# Patient Record
Sex: Male | Born: 1955 | Race: White | Hispanic: No | Smoking: Never smoker
Health system: Southern US, Community
[De-identification: ages and names within clinical notes are randomized; demographics above are authoritative.]

## PROBLEM LIST (undated history)

## (undated) DIAGNOSIS — F32A Depression, unspecified: Secondary | ICD-10-CM

## (undated) DIAGNOSIS — I10 Essential (primary) hypertension: Secondary | ICD-10-CM

---

## 2021-08-31 ENCOUNTER — Other Ambulatory Visit: Payer: Self-pay

## 2021-08-31 ENCOUNTER — Emergency Department: Payer: Medicare HMO

## 2021-08-31 ENCOUNTER — Inpatient Hospital Stay
Admission: EM | Admit: 2021-08-31 | Discharge: 2021-09-01 | DRG: 300 | Discharge status: Against Medical Advice | Payer: Medicare HMO | Attending: Hospitalist | Admitting: Hospitalist

## 2021-08-31 ENCOUNTER — Other Ambulatory Visit (INDEPENDENT_AMBULATORY_CARE_PROVIDER_SITE_OTHER): Payer: Self-pay | Admitting: Vascular Surgery

## 2021-08-31 DIAGNOSIS — Z7982 Long term (current) use of aspirin: Secondary | ICD-10-CM

## 2021-08-31 DIAGNOSIS — I7779 Dissection of other artery: Secondary | ICD-10-CM | POA: Diagnosis not present

## 2021-08-31 DIAGNOSIS — I748 Embolism and thrombosis of other arteries: Secondary | ICD-10-CM | POA: Diagnosis present

## 2021-08-31 DIAGNOSIS — R55 Syncope and collapse: Secondary | ICD-10-CM

## 2021-08-31 DIAGNOSIS — Z8249 Family history of ischemic heart disease and other diseases of the circulatory system: Secondary | ICD-10-CM

## 2021-08-31 DIAGNOSIS — F419 Anxiety disorder, unspecified: Secondary | ICD-10-CM | POA: Diagnosis present

## 2021-08-31 DIAGNOSIS — I774 Celiac artery compression syndrome: Secondary | ICD-10-CM

## 2021-08-31 DIAGNOSIS — K219 Gastro-esophageal reflux disease without esophagitis: Secondary | ICD-10-CM | POA: Diagnosis present

## 2021-08-31 DIAGNOSIS — I10 Essential (primary) hypertension: Secondary | ICD-10-CM | POA: Diagnosis present

## 2021-08-31 DIAGNOSIS — K760 Fatty (change of) liver, not elsewhere classified: Secondary | ICD-10-CM | POA: Diagnosis present

## 2021-08-31 DIAGNOSIS — F32A Depression, unspecified: Secondary | ICD-10-CM | POA: Diagnosis present

## 2021-08-31 DIAGNOSIS — Z5321 Procedure and treatment not carried out due to patient leaving prior to being seen by health care provider: Secondary | ICD-10-CM | POA: Diagnosis present

## 2021-08-31 DIAGNOSIS — G4700 Insomnia, unspecified: Secondary | ICD-10-CM | POA: Diagnosis present

## 2021-08-31 DIAGNOSIS — Z20822 Contact with and (suspected) exposure to covid-19: Secondary | ICD-10-CM | POA: Diagnosis present

## 2021-08-31 DIAGNOSIS — Z79899 Other long term (current) drug therapy: Secondary | ICD-10-CM

## 2021-08-31 DIAGNOSIS — R109 Unspecified abdominal pain: Secondary | ICD-10-CM

## 2021-08-31 DIAGNOSIS — D72829 Elevated white blood cell count, unspecified: Secondary | ICD-10-CM | POA: Diagnosis present

## 2021-08-31 DIAGNOSIS — E785 Hyperlipidemia, unspecified: Secondary | ICD-10-CM | POA: Diagnosis present

## 2021-08-31 DIAGNOSIS — R911 Solitary pulmonary nodule: Secondary | ICD-10-CM | POA: Diagnosis present

## 2021-08-31 HISTORY — DX: Essential (primary) hypertension: I10

## 2021-08-31 HISTORY — DX: Depression, unspecified: F32.A

## 2021-08-31 LAB — RESP PANEL BY RT-PCR (FLU A&B, COVID) ARPGX2
Influenza A by PCR: NEGATIVE
Influenza B by PCR: NEGATIVE
SARS Coronavirus 2 by RT PCR: NEGATIVE

## 2021-08-31 LAB — CBC WITH DIFFERENTIAL/PLATELET
Abs Immature Granulocytes: 0.05 10*3/uL (ref 0.00–0.07)
Basophils Absolute: 0 10*3/uL (ref 0.0–0.1)
Basophils Relative: 0 %
Eosinophils Absolute: 0.3 10*3/uL (ref 0.0–0.5)
Eosinophils Relative: 2 %
HCT: 41.3 % (ref 39.0–52.0)
Hemoglobin: 14.1 g/dL (ref 13.0–17.0)
Immature Granulocytes: 0 %
Lymphocytes Relative: 13 %
Lymphs Abs: 1.6 10*3/uL (ref 0.7–4.0)
MCH: 31.5 pg (ref 26.0–34.0)
MCHC: 34.1 g/dL (ref 30.0–36.0)
MCV: 92.2 fL (ref 80.0–100.0)
Monocytes Absolute: 0.8 10*3/uL (ref 0.1–1.0)
Monocytes Relative: 7 %
Neutro Abs: 9 10*3/uL — ABNORMAL HIGH (ref 1.7–7.7)
Neutrophils Relative %: 78 %
Platelets: 272 10*3/uL (ref 150–400)
RBC: 4.48 MIL/uL (ref 4.22–5.81)
RDW: 12.8 % (ref 11.5–15.5)
WBC: 11.8 10*3/uL — ABNORMAL HIGH (ref 4.0–10.5)
nRBC: 0 % (ref 0.0–0.2)

## 2021-08-31 LAB — LIPASE, BLOOD: Lipase: 52 U/L — ABNORMAL HIGH (ref 11–51)

## 2021-08-31 LAB — COMPREHENSIVE METABOLIC PANEL
ALT: 32 U/L (ref 0–44)
AST: 40 U/L (ref 15–41)
Albumin: 3.7 g/dL (ref 3.5–5.0)
Alkaline Phosphatase: 61 U/L (ref 38–126)
Anion gap: 7 (ref 5–15)
BUN: 27 mg/dL — ABNORMAL HIGH (ref 8–23)
CO2: 27 mmol/L (ref 22–32)
Calcium: 9.2 mg/dL (ref 8.9–10.3)
Chloride: 103 mmol/L (ref 98–111)
Creatinine, Ser: 1.23 mg/dL (ref 0.61–1.24)
GFR, Estimated: >60 mL/min (ref >60)
Glucose, Bld: 129 mg/dL — ABNORMAL HIGH (ref 70–99)
Potassium: 4 mmol/L (ref 3.5–5.1)
Sodium: 137 mmol/L (ref 135–145)
Total Bilirubin: 0.8 mg/dL (ref 0.3–1.2)
Total Protein: 7.2 g/dL (ref 6.5–8.1)

## 2021-08-31 LAB — URINALYSIS, COMPLETE (UACMP) WITH MICROSCOPIC
Bilirubin Urine: NEGATIVE
Glucose, UA: NEGATIVE mg/dL
Hgb urine dipstick: NEGATIVE
Ketones, ur: NEGATIVE mg/dL
Leukocytes,Ua: NEGATIVE
Nitrite: NEGATIVE
Protein, ur: NEGATIVE mg/dL
Specific Gravity, Urine: 1.014 (ref 1.005–1.030)
Squamous Epithelial / HPF: NONE SEEN (ref 0–5)
pH: 6 (ref 5.0–8.0)

## 2021-08-31 LAB — HEMOGLOBIN AND HEMATOCRIT, BLOOD
HCT: 33.6 % — ABNORMAL LOW (ref 39.0–52.0)
HCT: 37 % — ABNORMAL LOW (ref 39.0–52.0)
HCT: 37.3 % — ABNORMAL LOW (ref 39.0–52.0)
Hemoglobin: 11.4 g/dL — ABNORMAL LOW (ref 13.0–17.0)
Hemoglobin: 12.5 g/dL — ABNORMAL LOW (ref 13.0–17.0)
Hemoglobin: 12.8 g/dL — ABNORMAL LOW (ref 13.0–17.0)

## 2021-08-31 LAB — TYPE AND SCREEN
ABO/RH(D): A NEG
Antibody Screen: NEGATIVE

## 2021-08-31 LAB — LACTIC ACID, PLASMA: Lactic Acid, Venous: 1.2 mmol/L (ref 0.5–1.9)

## 2021-08-31 LAB — TROPONIN I (HIGH SENSITIVITY)
Troponin I (High Sensitivity): 3 ng/L (ref <18)
Troponin I (High Sensitivity): <2 ng/L (ref <18)

## 2021-08-31 LAB — HIV ANTIBODY (ROUTINE TESTING W REFLEX): HIV Screen 4th Generation wRfx: NONREACTIVE

## 2021-08-31 MED ORDER — LAMOTRIGINE 100 MG PO TABS: 500.0000 mg | ORAL_TABLET | Freq: Every day | ORAL | Status: DC

## 2021-08-31 MED ORDER — TRAZODONE HCL 50 MG PO TABS
150.0000 mg | ORAL_TABLET | Freq: Every evening | ORAL | Status: DC | PRN
  Administered 2021-08-31: 150 mg via ORAL
  Filled 2021-08-31: qty 1

## 2021-08-31 MED ORDER — PANTOPRAZOLE SODIUM 40 MG PO TBEC
80.0000 mg | DELAYED_RELEASE_TABLET | Freq: Every day | ORAL | Status: DC
  Administered 2021-08-31 – 2021-09-01 (×2): 80 mg via ORAL
  Filled 2021-08-31 (×2): qty 2

## 2021-08-31 MED ORDER — PROPRANOLOL HCL ER 80 MG PO CP24
80.0000 mg | ORAL_CAPSULE | Freq: Every day | ORAL | Status: DC
  Administered 2021-08-31 – 2021-09-01 (×2): 80 mg via ORAL
  Filled 2021-08-31 (×3): qty 1

## 2021-08-31 MED ORDER — ACETAMINOPHEN 650 MG RE SUPP
650.0000 mg | Freq: Four times a day (QID) | RECTAL | Status: DC | PRN
  Filled 2021-08-31: qty 1

## 2021-08-31 MED ORDER — VENLAFAXINE HCL ER 75 MG PO CP24
300.0000 mg | ORAL_CAPSULE | Freq: Every day | ORAL | Status: DC
  Administered 2021-08-31 – 2021-09-01 (×2): 300 mg via ORAL
  Filled 2021-08-31: qty 2
  Filled 2021-08-31: qty 4

## 2021-08-31 MED ORDER — LAMOTRIGINE 100 MG PO TABS
500.0000 mg | ORAL_TABLET | Freq: Every day | ORAL | Status: DC
  Administered 2021-08-31: 500 mg via ORAL
  Filled 2021-08-31: qty 5

## 2021-08-31 MED ORDER — ONDANSETRON HCL 4 MG/2ML IJ SOLN: 4.0000 mg | Freq: Four times a day (QID) | INTRAMUSCULAR | Status: DC | PRN

## 2021-08-31 MED ORDER — SODIUM CHLORIDE 0.9 % IV BOLUS
1000.0000 mL | Freq: Once | INTRAVENOUS | Status: AC
  Administered 2021-08-31: 1000 mL via INTRAVENOUS

## 2021-08-31 MED ORDER — HEPARIN SODIUM (PORCINE) 5000 UNIT/ML IJ SOLN
5000.0000 [IU] | Freq: Three times a day (TID) | INTRAMUSCULAR | Status: AC
  Administered 2021-08-31 – 2021-09-01 (×2): 5000 [IU] via SUBCUTANEOUS
  Filled 2021-08-31 (×2): qty 1

## 2021-08-31 MED ORDER — IOHEXOL 300 MG/ML  SOLN
100.0000 mL | Freq: Once | INTRAMUSCULAR | Status: AC | PRN
  Administered 2021-08-31: 100 mL via INTRAVENOUS

## 2021-08-31 MED ORDER — HEPARIN SODIUM (PORCINE) 5000 UNIT/ML IJ SOLN: 5000.0000 [IU] | Freq: Three times a day (TID) | INTRAMUSCULAR | Status: DC

## 2021-08-31 MED ORDER — IOHEXOL 350 MG/ML SOLN
100.0000 mL | Freq: Once | INTRAVENOUS | Status: AC | PRN
  Administered 2021-08-31: 100 mL via INTRAVENOUS

## 2021-08-31 MED ORDER — SODIUM CHLORIDE 0.9 % IV SOLN: INTRAVENOUS | Status: DC

## 2021-08-31 MED ORDER — PRAVASTATIN SODIUM 20 MG PO TABS: 40.0000 mg | ORAL_TABLET | Freq: Every day | ORAL | Status: DC

## 2021-08-31 MED ORDER — ATORVASTATIN CALCIUM 20 MG PO TABS
80.0000 mg | ORAL_TABLET | Freq: Every day | ORAL | Status: DC
  Filled 2021-08-31: qty 4

## 2021-08-31 MED ORDER — ONDANSETRON HCL 4 MG PO TABS: 4.0000 mg | ORAL_TABLET | Freq: Four times a day (QID) | ORAL | Status: DC | PRN

## 2021-08-31 MED ORDER — ACETAMINOPHEN 325 MG PO TABS: 650.0000 mg | ORAL_TABLET | Freq: Four times a day (QID) | ORAL | Status: DC | PRN

## 2021-08-31 MED ORDER — BUSPIRONE HCL 15 MG PO TABS
15.0000 mg | ORAL_TABLET | Freq: Three times a day (TID) | ORAL | Status: DC
  Administered 2021-08-31 – 2021-09-01 (×3): 15 mg via ORAL
  Filled 2021-08-31 (×5): qty 1

## 2021-08-31 NOTE — ED Notes (Signed)
Pt sitting up on side of bed eating lunch tray.

## 2021-08-31 NOTE — Consult Note (Signed)
[facility] SURGICAL CONSULTATION NOTE (initial) - cpt: 37858   HISTORY OF PRESENT ILLNESS (HPI):  65 y.o. male presented to [facility] ED today for evaluation of abdominal pain and syncope. Patient reports that in the preceding 2-3 days he had what he described as a burning sensation in his abdomen which was not exacerbated or relieved by anything. He had some nausea associated with this. He got up to go to the bathroom in the middle of the night and woke up on the floor. He denied any CP, SOB, lightheadedness, or dizziness prior to the syncopal episode. He denied any fever, chills, emesis, diarrhea, or blood in his stool. No previous intra-abdominal surgeries. He is on 81 mg ASA but otherwise no anticoagulation. Work up in the ED revealed a mild leukocytosis to 11.8K, Hgb 14.1, sCr in normal ranges at 1.23, no electrolyte derangements, and lactic acid level was normal at 1.2. 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 He was also found to have a very small amount of left pericolic gutter fluid.   Surgery is consulted by emergency medicine physician Dr. 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  PAST MEDICAL HISTORY (PMH):  History reviewed. No pertinent past medical history.   PAST SURGICAL HISTORY (PSH):  Reviewed   MEDICATIONS:  Prior to Admission medications   Medication Sig Start Date End Date Taking? Authorizing Provider  Aspirin 81 MG CAPS Take 1 tablet by mouth daily. 07/25/08  Yes [provider]  atorvastatin (LIPITOR) 80 MG tablet Take 80 mg by mouth daily. 08/19/21  Yes [provider]  busPIRone (BUSPAR) 15 MG tablet Take 15 mg by mouth 3 (three) times daily. 08/23/21  Yes [provider]  Ciclopirox 1 % shampoo  11/06/13  Yes [provider]  ketoconazole (NIZORAL) 2 %  shampoo Apply topically. 05/03/14  Yes [provider]  lamoTRIgine (LAMICTAL) 200 MG tablet Take 500 mg by mouth daily. 06/02/14 11/21/21 Yes [provider]  omeprazole (PRILOSEC) 40 MG capsule Take 40 mg by mouth daily. 08/03/21  Yes [provider]  pravastatin (PRAVACHOL) 40 MG tablet Take 1 tablet by mouth daily. 07/07/14  Yes [provider]  propranolol ER (INDERAL LA) 80 MG 24 hr capsule Take 80 mg by mouth daily. 08/03/21  Yes [provider]  Selenium Sulfide 2.25 % SHAM Apply topically. 08/19/21  Yes [provider]  tamsulosin (FLOMAX) 0.4 MG CAPS capsule Take 0.4 mg by mouth daily. 07/02/21  Yes [provider]  traZODone (DESYREL) 150 MG tablet Take 150-300 mg by mouth at bedtime as needed. 07/05/21  Yes [provider]  venlafaxine XR (EFFEXOR-XR) 150 MG 24 hr capsule Take 300 mg by mouth daily. 07/21/21  Yes [provider]  Vitamin D, Cholecalciferol, 10 MCG (400 UNIT) CAPS Take 2 capsules by mouth daily. 11/12/08  Yes [provider]  fenofibrate (TRICOR) 145 MG tablet Take 145 mg by mouth daily. Patient not taking: Reported on 08/31/2021 06/12/21   [provider]  ranitidine (ZANTAC) 150 MG tablet Take 1 tablet by mouth daily. Patient not taking: Reported on 08/31/2021 06/09/14   [provider]     ALLERGIES:  No Known Allergies   SOCIAL HISTORY:  Social History   Socioeconomic History   Marital status: Legally Separated    Spouse name: Not on file  Number of children: Not on file   Years of education: Not on file   Highest education level: Not on file  Occupational History   Not on file  Tobacco Use   Smoking status: Not on file   Smokeless tobacco: Not on file  Substance and Sexual Activity   Alcohol use: Not on file   Drug use: Not on file   Sexual activity: Not on file  Other Topics Concern   Not on file  Social History Narrative   Not on file   Social  Determinants of Health   Financial Resource Strain: Not on file  Food Insecurity: Not on file  Transportation Needs: Not on file  Physical Activity: Not on file  Stress: Not on file  Social Connections: Not on file  Intimate Partner Violence: Not on file     FAMILY HISTORY:  No family history on file.    REVIEW OF SYSTEMS:  Review of Systems  Constitutional:  Negative for chills and fever.  Respiratory:  Negative for cough and shortness of breath.   Cardiovascular:  Negative for chest pain and palpitations.  Gastrointestinal:  Positive for abdominal pain. Negative for blood in stool, constipation, diarrhea, nausea and vomiting.  Genitourinary:  Negative for dysuria and urgency.  Neurological:  Positive for loss of consciousness. Negative for dizziness and headaches.  All other systems reviewed and are negative.  VITAL SIGNS:  Temp:  [97.9 F (36.6 C)] 97.9 F (36.6 C) (11/22 0541) Pulse Rate:  [77-102] 91 (11/22 1200) Resp:  [14-20] 15 (11/22 1200) BP: (103-135)/(67-79) 111/74 (11/22 1200) SpO2:  [94 %-100 %] 95 % (11/22 1200) Weight:  [102.1 kg] 102.1 kg (11/22 0541)     Height: 6' (182.9 cm) Weight: 102.1 kg BMI (Calculated): 30.52   INTAKE/OUTPUT:  No intake/output data recorded.  PHYSICAL EXAM:  Physical Exam Vitals and nursing note reviewed.  Constitutional:      General: He is not in acute distress.    Appearance: He is well-developed. He is obese. He is not ill-appearing.  HENT:     Head: Normocephalic and atraumatic.  Eyes:     General: No scleral icterus.    Extraocular Movements: Extraocular movements intact.     Pupils: Pupils are equal, round, and reactive to light.  Cardiovascular:     Rate and Rhythm: Normal rate and regular rhythm.     Heart sounds: Normal heart sounds. No murmur heard. Pulmonary:     Effort: Pulmonary effort is normal. No respiratory distress.     Breath sounds: Normal breath sounds.  Abdominal:     General: There is no  distension.     Palpations: Abdomen is soft.     Tenderness: There is no abdominal tenderness. There is no guarding or rebound.     Comments: Abdomen is obese, soft, he is non-tender, non-distended, no rebound/guarding. He is certainly without any evidence of peritonitis  Genitourinary:    Comments: Deferred Skin:    General: Skin is warm and dry.     Coloration: Skin is not jaundiced or pale.  Neurological:     General: No focal deficit present.     Mental Status: He is alert and oriented to person, place, and time.  Psychiatric:        Mood and Affect: Mood normal.        Behavior: Behavior normal.     Labs:  CBC Latest Ref Rng & Units 08/31/2021  WBC 4.0 - 10.5 K/uL 11.8(H)  Hemoglobin 13.0 -  17.0 g/dL 14.1  Hematocrit 39.0 - 52.0 % 41.3  Platelets 150 - 400 K/uL 272   CMP Latest Ref Rng & Units 08/31/2021  Glucose 70 - 99 mg/dL 129(H)  BUN 8 - 23 mg/dL 27(H)  Creatinine 0.61 - 1.24 mg/dL 1.23  Sodium 135 - 145 mmol/L 137  Potassium 3.5 - 5.1 mmol/L 4.0  Chloride 98 - 111 mmol/L 103  CO2 22 - 32 mmol/L 27  Calcium 8.9 - 10.3 mg/dL 9.2  Total Protein 6.5 - 8.1 g/dL 7.2  Total Bilirubin 0.3 - 1.2 mg/dL 0.8  Alkaline Phos 38 - 126 U/L 61  AST 15 - 41 U/L 40  ALT 0 - 44 U/L 32     Imaging studies:   CT Abdomen/Pelvis (08/31/2021) personally reviewed showing celiac trunk dissection, small amount of left pericolic gutter fluid, no free air, no evidence of bowel ischemia, and radiologist report reviewed:  IMPRESSION: 1. 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 The splenic and common hepatic arteries are opacified distally. Recommend arterial phase CT angiogram to further evaluate and vascular consultation. 2. Note incidental vascular variant anatomy of the celiac branches with a separate origin of the left gastric artery superiorly. 3. Hepatic  steatosis. 4. Mild thickening of the urinary bladder wall, likely related to chronic outlet obstruction. 5. Coronary artery disease.   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 Visceral branches arising from the dissected celiac trunk remain opacified with contrast despite extensive stranding and signs of dissection.   Accessory LEFT hepatic artery as described.   Slight increase in fluid in the LEFT lower quadrant measures density that could be seen in the setting of small volume pneumoperitoneum. Perhaps this somehow relates to recent iodinated contrast administration and delayed enhancement of ascitic fluid. Occult bowel injury can present with isolated hemoperitoneum as well though this is not favored as there is no sign of focal bowel thickening or other findings to suggest trauma. No visible solid organ injury is demonstrated. I   Mild jejunal mesenteric stranding. In the setting of visceral arterial dissection would suggest lactate correlation. This may also be helpful in light of above findings of potential small volume hemoperitoneum.   Indeterminate potentially cystic lesion arising from the lower pole the LEFT kidney may represent a hemorrhagic cyst. Consider follow-up renal sonogram in this an initial means of further evaluating this abnormality on a nonemergent basis to exclude a solid renal lesion.   Multiple pulmonary nodules. Most severe: 4 mm left solid pulmonary nodule. No routine follow-up imaging is recommended, particularly in low risk patient population per [place]. while these guidelines do not apply to immunocompromised patients and patients with  cancer. Follow up in patients with significant comorbidities as clinically warranted. For lung cancer screening, adhere to Lung-RADS guidelines. Reference: Radiology. 2017; 284(1):228-43.   Assessment/Plan: (ICD-10's: R10.9) 65 y.o. 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   - Agree with medicine admission with vascular surgery consult - Nothing to add from general surgery perspective - Monitor abdominal  examination; on-going bowel function - Pain control prn; antiemetics prn   - Further management per primary and consulting teams; we will sign off but be available should need arise    All of the above findings and recommendations were discussed with the patient, and all of patient's questions were answered to his expressed satisfaction.  Thank you for the opportunity to participate in this patient's care.   -- Edison Simon, PA-C [facility] 08/31/2021, 12:08 PM [phone] M-F: 7am - 4pm

## 2021-08-31 NOTE — ED Notes (Signed)
Patient transported to CT 

## 2021-08-31 NOTE — ED Provider Notes (Signed)
[facility]  ____________________________________________   Event Date/Time   First MD Initiated Contact with Patient 08/31/21 [phone]     (approximate)  I have reviewed the triage vital signs and the nursing notes.   HISTORY  Chief Complaint Abdominal Pain    HPI Christian Mills is a 65 y.o. male with past medical history of hypertension, diabetes type 2, CKD, depression who presents with abdominal pain and syncope.  Patient tells me that over the past 3 days he has had an intermittent burning sensation in the center of his abdomen.  It is nonradiating and not worsened by eating.  Tonight the pain was worse when he went to bed and then woke him from sleep.  He got up to use the bathroom felt like he was going to vomit or have diarrhea but did not then walking back to bed he then fell and woke up on the floor.  Did not have any preceding chest pain shortness of breath or palpitations.  Did not have prodromal lightheadedness or dizziness.  Was in pain prior to the fall.  Patient denies dark stools or blood in his stool vomiting or nausea.  He denies chest pain shortness of breath or back pain.  Denies heavy NSAID use.        PMH HTN T2 DM CKD Depression   Prior to Admission medications   Not on File    Allergies Patient has no known allergies.  No family history on file.  Social History    Review of Systems   Review of Systems  Constitutional:  Negative for appetite change, chills and fever.  Respiratory:  Negative for chest tightness and shortness of breath.   Cardiovascular:  Negative for chest pain.  Gastrointestinal:  Positive for abdominal pain. Negative for blood in stool, constipation, diarrhea, nausea and vomiting.  Genitourinary:  Negative for dysuria.  Neurological:  Positive for syncope. Negative for light-headedness.  All other systems reviewed and are negative.  Physical Exam Updated Vital Signs BP 135/78   Pulse 80   Temp 97.9 F  (36.6 C) (Oral)   Resp 19   Ht 6' (1.829 m)   Wt 102.1 kg   SpO2 99%   BMI 30.53 kg/m   Physical Exam Vitals and nursing note reviewed.  Constitutional:      General: He is not in acute distress.    Appearance: Normal appearance.  HENT:     Head: Normocephalic and atraumatic.  Eyes:     General: No scleral icterus.    Conjunctiva/sclera: Conjunctivae normal.  Cardiovascular:     Rate and Rhythm: Normal rate and regular rhythm.  Pulmonary:     Effort: Pulmonary effort is normal. No respiratory distress.     Breath sounds: Normal breath sounds. No wheezing.  Abdominal:     General: Abdomen is flat. There is no distension.     Palpations: Abdomen is soft.     Tenderness: There is no abdominal tenderness. There is no guarding.  Musculoskeletal:        General: No deformity or signs of injury.     Cervical back: Normal range of motion.  Skin:    Coloration: Skin is not jaundiced or pale.  Neurological:     General: No focal deficit present.     Mental Status: He is alert and oriented to person, place, and time. Mental status is at baseline.  Psychiatric:        Mood and Affect: Mood normal.  Behavior: Behavior normal.     LABS (all labs ordered are listed, but only abnormal results are displayed)  Labs Reviewed  CBC WITH DIFFERENTIAL/PLATELET - Abnormal; Notable for the following components:      Result Value   WBC 11.8 (*)    Neutro Abs 9.0 (*)    All other components within normal limits  URINALYSIS, COMPLETE (UACMP) WITH MICROSCOPIC  COMPREHENSIVE METABOLIC PANEL  LIPASE, BLOOD  TROPONIN I (HIGH SENSITIVITY)  TROPONIN I (HIGH SENSITIVITY)   ____________________________________________  EKG  NSR, nml axis, nml intervals, no acute ischemic changes  ____________________________________________  RADIOLOGY Ky Barban, personally viewed and evaluated these images (plain radiographs) as part of my medical decision making, as well as reviewing  the written report by the radiologist.  ED MD interpretation:  CT A/P pending    ____________________________________________   PROCEDURES  Procedure(s) performed (including Critical Care):  Procedures   ____________________________________________   INITIAL IMPRESSION / ASSESSMENT AND PLAN / ED COURSE      65 year old male presents with 3 days of abdominal pain that is intermittent and a syncopal episode today.  Pain described as burning generalized in location and intermittent.  Not significantly worse after eating.  No associated nausea vomiting or diarrhea.  Today patient woke up from sleep with the pain and felt like he had to have an episode of vomiting or diarrhea.  Upon returning from the bathroom he had a syncopal episode without prodrome but did have significant pain at the time.  On arrival to the ED he is well-appearing.  Blood pressure within normal limits.  Patient is well-appearing.  His abdomen is benign and he denies pain currently.  Given his syncope and abdominal pain I did a quick bedside ultrasound which shows a normal caliber aorta.  We will get labs including a troponin and CT abdomen pelvis.  At the time of signout he is pending a majority of his work-up.  If no concerning findings on CT or labs I think he would be appropriate for discharge.   ____________________________________________   FINAL CLINICAL IMPRESSION(S) / ED DIAGNOSES  Final diagnoses:  Abdominal pain, unspecified abdominal location  Syncope, unspecified syncope type     ED Discharge Orders     None        Note:  This document was prepared using Dragon voice recognition software and may include unintentional dictation errors.    Georga Hacking, MD 08/31/21 [phone]

## 2021-08-31 NOTE — ED Provider Notes (Signed)
8:19 AM Assumed care for off going team.   Blood pressure 104/71, pulse 91, temperature 97.9 F (36.6 C), temperature source Oral, resp. rate 15, height 6' (1.829 m), weight 102.1 kg, SpO2 97 %.  See their HPI for full report but in brief pending labs. CT.   8:51 AM reevaluated patient.  Labs are still in process.  Patient is denying hitting his head denies any headache or neck pain or chest pain.  He reports he only had a burning sensation in his abdomen with a little nausea.  Symptoms are now relieved.  9:39 AM CT concerning for dissection of the celiac axis and recommend a CTA and vascular consult.  Will discuss with Dr. Gilda Crease   11:18 AM discussed with radiology who stated that there is a focal dissection of the celiac artery with some thrombosis.  He also notes a little bit of hemoperitoneum in the left lower quadrant very small amount unclear why.  11:52 AM discussed with Dr. Gilda Crease from vascular who will evaluate patient but plan to do serial hemoglobins and if stable start him on heparin tomorrow and plan for possible celiac angiogram tomorrow.  I also will consult surgery Dr. Aleen Campi   12:04 PM  D/w Dr. Aleen Campi unlikely anything surgical to do but he will come down and evaluate patient.  Will discuss with the hospital team for admission with vascular surgery and general surgery consulted on.                Concha Se, MD 08/31/21 [phone]

## 2021-08-31 NOTE — ED Notes (Signed)
RN called lab to check on CMP status. Lab tech states Cr is 1.24 and CT made aware

## 2021-08-31 NOTE — ED Triage Notes (Signed)
Signed            Pt in from home via [facility] with c/o "burning pain" to central abdomen, on going 2-3 days. Pt states he got up to go to the bathroom this morning, and had syncopal event. Reporting nausea and some urge to have diarrhea. Denies any cp, black stools or sob. CBG 249

## 2021-08-31 NOTE — H&P (Addendum)
History and Physical   Christian Mills [id number] DOB: 1956-09-15 DOA: 08/31/2021  PCP: [facility]  Outpatient Specialists:  Patient coming from: [facility]  I have personally briefly reviewed patient's old medical records in [place].  Chief Concern: Syncope  HPI: Christian Mills is a 65 y.o. male with medical history significant for hyperlipidemia, depression, GERD, hypertension  He was asleep, woke up feeling burning sensation in his belly. He then felt nauseous and thought he needed to vomit so he went to bathroom, he did not vomit. On his way back from the restroom, he felt weak and he collapsed. He states that he must have passed out because he woke up on the floor. He called 911 from his cell phone approximately at 3:07 AM. He is unsure if he hit his head, though he states that he doesn't have any headache and/or feel knots in his head. He states that he was sleeping when he woke up. He states it was still dark outside but he was not sure of the time.   He endorses diaphoresis when he came to.   Prior to passing out, he denies chest pain, shortness of breath, dysuria, diarrhea, hematuria, double vision, headache, new cough, fever, chills, dysphagia. He does not know how long he was out. He states that this has never happened before.   He states that in the last couple of days, he has had abdominal burning sensation that has lasted several hours. He states that it helps when he goes to sleep in the last two nights.   Social history: He lives at home by himself. He denies tobacco, etoh, recreational drugs. He is retired and prior worked as a Engineer, structural in [place], [place].   Vaccination history: He is vaccinated for covid 19, three doses of Pfizer.  ROS: Constitutional: no weight change, no fever ENT/Mouth: no sore throat, no rhinorrhea Eyes: no eye pain, no vision changes Cardiovascular: no chest pain, no dyspnea,  no edema, no palpitations Respiratory: no  cough, no sputum, no wheezing Gastrointestinal: + nausea, no vomiting, no diarrhea, no constipation Genitourinary: no urinary incontinence, no dysuria, no hematuria Musculoskeletal: no arthralgias, no myalgias Skin: no skin lesions, no pruritus, Neuro: + weakness, + loss of consciousness, + syncope Psych: no anxiety, no depression, + decrease appetite Heme/Lymph: no bruising, no bleeding  ED Course: Discussed with emergency medicine provider, patient requiring hospitalization for chief concerns of syncope.    In the emergency department, her vitals showed temperature of 97.9, respiration rate of 20, heart rate of 79, initial blood pressure 120/79, SPO2 of 100% on room air.  Labs in the emergency department was remarkable for serum sodium 137, potassium 4, chloride is 103, bicarb 27, BUN of 27, serum creatinine of 1.23, nonfasting blood glucose 129.  GFR greater than 60.  WBC 11.8, hemoglobin initially was 14.1 and decreased to 12.5.  Platelets is 272.  Lactic acid was 1.2.  UA was negative for leukocytes and nitrates.  Assessment/Plan  Principal Problem:   Celiac artery dissection (HCC) Active Problems:   Depression   Insomnia   Hyperlipidemia   Hepatic steatosis   Leukocytosis   # Celiac artery thrombosis with dissection - Vascular has been consulted - N.p.o. after midnight for plans of stent placement on 09/01/2021  # Depression # Psychiatric imbalance - Resumed home buspirone 50 mg p.o. 3 times daily, venlafaxine 300 mg p.o. daily, lamotrigine 500 mg daily nightly  # Left lower lobe nodule-4 mm, does not have  risk factors for carcinoma as he denies adamantly that he does not smoke or use tobacco products and denies known history of carcinoma  # Insomnia-resumed trazodone 150 mg nightly # Hypertension anxiety-resume home propranolol 80 mg daily # Hyperlipidemia-atorvastatin 80 mg nightly # GERD-PPI  # DVT prophylaxis-I ordered 2 doses of heparin 5000 units subcutaneous every  8 hours - A.m. team to resume DVT prophylaxis when appropriate  Chart reviewed.   DVT prophylaxis: Heparin 5000 units subcutaneous every 8 hours Code Status: Full code Diet: Heart healthy now, n.p.o. after midnight Family Communication: No, patient states he will communicate with his family Disposition Plan: Pending clinical course Consults called: Vascular, general surgery Admission status: Telemetry medical, observation  Past Medical History:  Diagnosis Date   Depression    Hypertension    History reviewed. No pertinent surgical history.  Social History:  reports that he has never smoked. He has never used smokeless tobacco. He reports that he does not drink alcohol and does not use drugs.  No Known Allergies Family History  Problem Relation Age of Onset   Hypertension Mother    Hypertension Father    Family history: Family history reviewed and not pertinent  Prior to Admission medications   Medication Sig Start Date End Date Taking? Authorizing Provider  Aspirin 81 MG CAPS Take 1 tablet by mouth daily. 07/25/08  Yes [provider]  atorvastatin (LIPITOR) 80 MG tablet Take 80 mg by mouth daily. 08/19/21  Yes [provider]  busPIRone (BUSPAR) 15 MG tablet Take 15 mg by mouth 3 (three) times daily. 08/23/21  Yes [provider]  Ciclopirox 1 % shampoo  11/06/13  Yes [provider]  ketoconazole (NIZORAL) 2 % shampoo Apply topically. 05/03/14  Yes [provider]  lamoTRIgine (LAMICTAL) 200 MG tablet Take 500 mg by mouth daily. 06/02/14 11/21/21 Yes [provider]  omeprazole (PRILOSEC) 40 MG capsule Take 40 mg by mouth daily. 08/03/21  Yes [provider]  pravastatin (PRAVACHOL) 40 MG tablet Take 1 tablet by mouth daily. 07/07/14  Yes [provider]  propranolol ER (INDERAL LA) 80 MG 24 hr capsule Take 80 mg by mouth daily. 08/03/21  Yes [provider]  Selenium Sulfide 2.25 % SHAM Apply  topically. 08/19/21  Yes [provider]  tamsulosin (FLOMAX) 0.4 MG CAPS capsule Take 0.4 mg by mouth daily. 07/02/21  Yes [provider]  traZODone (DESYREL) 150 MG tablet Take 150-300 mg by mouth at bedtime as needed. 07/05/21  Yes [provider]  venlafaxine XR (EFFEXOR-XR) 150 MG 24 hr capsule Take 300 mg by mouth daily. 07/21/21  Yes [provider]  Vitamin D, Cholecalciferol, 10 MCG (400 UNIT) CAPS Take 2 capsules by mouth daily. 11/12/08  Yes [provider]  fenofibrate (TRICOR) 145 MG tablet Take 145 mg by mouth daily. Patient not taking: Reported on 08/31/2021 06/12/21   [provider]  ranitidine (ZANTAC) 150 MG tablet Take 1 tablet by mouth daily. Patient not taking: Reported on 08/31/2021 06/09/14   [provider]   Physical Exam: Vitals:   08/31/21 1000 08/31/21 1101 08/31/21 1200 08/31/21 1300  BP: 117/75 113/73 111/74 106/72  Pulse: 97 93 91 93  Resp: 16 18 15 13   Temp:      TempSrc:      SpO2: 94% 95% 95% 93%  Weight:      Height:       Constitutional: appears age-appropriate, NAD, calm, comfortable Eyes: PERRL, lids and conjunctivae  normal ENMT: Mucous membranes are moist. Posterior pharynx clear of any exudate or lesions. Age-appropriate dentition. Hearing appropriate Neck: normal, supple, no masses, no thyromegaly Respiratory: clear to auscultation bilaterally, no wheezing, no crackles. Normal respiratory effort. No accessory muscle use.  Cardiovascular: Regular rate and rhythm, no murmurs / rubs / gallops. No extremity edema. 2+ pedal pulses. No carotid bruits.  Abdomen: no tenderness, no masses palpated, no hepatosplenomegaly. Bowel sounds positive.  Musculoskeletal: no clubbing / cyanosis. No joint deformity upper and lower extremities. Good ROM, no contractures, no atrophy. Normal muscle tone.  Skin: no rashes, lesions, ulcers. No induration Neurologic: Sensation intact. Strength 5/5 in all 4.   Psychiatric: Normal judgment and insight. Alert and oriented x 3. Normal mood.   EKG: independently reviewed, showing sinus rhythm with rate of 77, QTc 436  Chest x-ray on Admission: I personally reviewed and I agree with radiologist reading as below.  CT ABDOMEN PELVIS W CONTRAST  Result Date: 08/31/2021 CLINICAL DATA:  Abdominal pain for 3 days EXAM: CT ABDOMEN AND PELVIS WITH CONTRAST TECHNIQUE: Multidetector CT imaging of the abdomen and pelvis was performed using the standard protocol following bolus administration of intravenous contrast. CONTRAST:  OMNIPAQUE IOHEXOL 300 MG/ML  SOLN COMPARISON:  None. FINDINGS: Lower chest: No acute abnormality. Scattered left coronary artery calcifications. Hepatobiliary: No solid liver abnormality is seen. Hepatic steatosis. No gallstones, gallbladder wall thickening, or biliary dilatation. Pancreas: Unremarkable. No pancreatic ductal dilatation or surrounding inflammatory changes. Spleen: Normal in size without significant abnormality. Adrenals/Urinary Tract: Adrenal glands are unremarkable. Kidneys are normal, without renal calculi, solid lesion, or hydronephrosis. Mild thickening of the urinary bladder wall. Stomach/Bowel: Stomach is within normal limits. Appendix appears normal. No evidence of bowel wall thickening, distention, or inflammatory changes. Vascular/Lymphatic: There appears to be a dissection of the celiac axis near the origin, with mild dilatation of the vessel to approximately 1.2 cm in caliber and adjacent fat stranding (series 2, image 36, series 6, image 79). The splenic and common hepatic arteries are opacified distally. Note incidental vascular variant with a separate origin of the left gastric artery superiorly. Aortic atherosclerosis. No enlarged abdominal or pelvic lymph nodes. Reproductive: No mass or other significant abnormality. Other: No abdominal wall hernia or abnormality. No abdominopelvic ascites. Musculoskeletal: No acute  or significant osseous findings. IMPRESSION: 1. 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 The splenic and common hepatic arteries are opacified distally. Recommend arterial phase CT angiogram to further evaluate and vascular consultation. 2. Note incidental vascular variant anatomy of the celiac branches with a separate origin of the left gastric artery superiorly. 3. Hepatic steatosis. 4. Mild thickening of the urinary bladder wall, likely related to chronic outlet obstruction. 5. Coronary artery disease. Aortic Atherosclerosis (ICD10-I70.0). Electronically Signed   By: Jearld Lesch M.D.   On: 08/31/2021 09:17   CT Angio Chest/Abd/Pel for Dissection W and/or Wo Contrast  Addendum Date: 08/31/2021   ADDENDUM REPORT: 08/31/2021 15:01 ADDENDUM: Dissection of the celiac with partial thrombosis of the vessel lumen as well as higher density fluid in the abdomen as discussed in the initial report were called by telephone at the time of interpretation on 08/31/2021 at 11:15 AM to provider [facility] , who verbally acknowledged these results. Electronically Signed   By: Donzetta Kohut M.D.   On: 08/31/2021 15:01   Result Date: 08/31/2021 CLINICAL DATA:  A  65 year old male presents with abdominal pain and suspected aortic dissection, follow-up evaluation from abdomen and pelvis CT which was acquired on August 31, 2021, earlier the same date. EXAM: CT ANGIOGRAPHY CHEST, ABDOMEN AND PELVIS TECHNIQUE: Non-contrast CT of the chest was initially obtained. Multidetector CT imaging through the chest, abdomen and pelvis was performed using the standard protocol during bolus administration of intravenous contrast. Multiplanar reconstructed images and MIPs were obtained and reviewed to evaluate the vascular anatomy. CONTRAST:  141mL OMNIPAQUE IOHEXOL 350 MG/ML SOLN COMPARISON:  Comparison is made with  abdominal and pelvic CT of the same date. FINDINGS: CTA CHEST FINDINGS Cardiovascular: Noncontrast CT first obtained of the chest shows no signs of intramural hematoma or pericardial effusion. Ascending aortic caliber approximately 3.7 cm transverse dimension, when measured in the coronal plane the ascending aorta is approximately 3.6 cm greatest dimension. No signs of aortic dissection involving the thoracic aorta on this non gated evaluation. No sign of stranding adjacent to the aorta. Three-vessel branching pattern in the chest. Normal heart size without substantial pericardial effusion. Normal caliber of central pulmonary arteries which are not well assessed on this phase of contrast enhancement. Mediastinum/Nodes: No thoracic inlet, axillary, mediastinal or hilar adenopathy. Esophagus grossly normal. Lungs/Pleura: No effusion, no consolidation or pneumothorax. Airways are patent. Tiny LEFT lower lobe pulmonary nodule (image 114/6) 4 mm. Tiny LEFT lower lobe pulmonary nodule (image 106/6) 4 mm. Musculoskeletal: See below for full musculoskeletal detail. Ovoid low-density subcutaneous lesion on the first image of the study measures water density and 2.8 cm likely a sebaceous cyst. No chest wall lesion. See below for full musculoskeletal details. Review of the MIP images confirms the above findings. CTA ABDOMEN AND PELVIS FINDINGS VASCULAR Aorta: Aortic atherosclerosis. No aneurysmal dilation of the thoracic aorta. No sign of dissection or perivascular stranding about the abdominal aorta or iliac vessels. Celiac: Stranding about the celiac axis. 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 The splenic artery beyond this level is patent. Caliber of the celiac trunk approximately 12 mm. There is an accessory LEFT hepatic artery arising as a dedicated branch just above the celiac origin. Remaining hepatic vessels arising from the dissected celiac trunk are  patent despite stranding surrounding the common hepatic artery, likely due to dissection propagation into this vessel. SMA: Widely patent without dilation, dissection or adjacent stranding. Renals: Early bifurcation of the RIGHT renal artery, main renal artery with an accessory artery arising from the aorta to the upper pole as well. Early bifurcation of single LEFT renal artery. No perivascular stranding or signs of dissection of the renal vasculature. IMA: Patent without evidence of aneurysm, dissection, vasculitis or significant stenosis. Inflow: Patent without evidence of aneurysm, dissection, vasculitis or significant stenosis. Proximal outflow: Iliac vessels are patent. No stranding or signs of dissection in the iliac vasculature. Veins: Veins not well assessed on the current evaluation, normal caliber. Smooth contour of the IVC. Review of the MIP images confirms the above findings. NON-VASCULAR Hepatobiliary: Signs of hepatic steatosis. No perihepatic stranding. Trace amount of fluid along the inferior margin of the RIGHT hemi liver, too small to measure density values in this location. No focal hepatic lesion no pericholecystic stranding or sign of biliary duct distension. Pancreas: Normal, without mass, inflammation or ductal dilatation. Spleen: Top normal splenic size. No current signs of gross. Splenic infarct Adrenals/Urinary Tract: Normal adrenal glands. Cysts in the bilateral kidneys. Intermediate density arises from the lower pole of the LEFT kidney which  is indeterminate based on density values potentially hemorrhagic cyst measuring 16 mm (image 136/5) lateral lower pole of the LEFT kidney. Small diverticula along the LEFT urinary bladder. No hydronephrosis. Stomach/Bowel: Bowel enhancement is preserved. No pneumatosis. Normal appendix. Mild jejunal mesenteric stranding is suggested. No focal bowel wall thickening. Lymphatic: No adenopathy in the abdomen or in the pelvis. Reproductive: Unremarkable  on CT. Other: Fluid in the LEFT pericolic gutter with density value of 37-47 Hounsfield units may be slightly increased compared to the previous CT evaluation remaining very small volume. This measures approximately 1.8 x 0.9 cm and was previously not measurable similarly with small amount of fluid anterior to the urinary bladder 38 Hounsfield units (image 187/5). Small to moderate bilateral fat containing inguinal hernias LEFT greater than RIGHT. No free air. Musculoskeletal: No acute musculoskeletal finding or destructive bone lesion. Spinal degenerative changes. Review of the MIP images confirms the above findings. 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 Visceral branches arising from the dissected celiac trunk remain opacified with contrast despite extensive stranding and signs of dissection. Accessory LEFT hepatic artery as described. Slight increase in fluid in the LEFT lower quadrant measures density that could be seen in the setting of small volume pneumoperitoneum. Perhaps this somehow relates to recent iodinated contrast administration and delayed enhancement of ascitic fluid. Occult bowel injury can present with isolated hemoperitoneum as well though this is not favored as there is no sign of focal bowel thickening or other findings to suggest trauma. No visible solid organ injury is demonstrated. I Mild jejunal mesenteric stranding. In the setting of visceral arterial dissection would suggest lactate correlation. This may also be helpful in light of above findings of potential small volume hemoperitoneum. Indeterminate potentially cystic lesion arising from the lower pole the LEFT kidney may represent a hemorrhagic cyst. Consider follow-up renal sonogram in this an initial means of further evaluating this abnormality on a nonemergent basis to exclude a solid renal lesion. Multiple pulmonary nodules. Most severe: 4 mm left solid pulmonary nodule. No routine  follow-up imaging is recommended, particularly in low risk patient population per [facility]. while these guidelines do not apply to immunocompromised patients and patients with cancer. Follow up in patients with significant comorbidities as clinically warranted. For lung cancer screening, adhere to Lung-RADS guidelines. Reference: Radiology. 2017; 284(1):228-43. Electronically Signed: By: Zetta Bills M.D. On: 08/31/2021 11:26    Labs on Admission: I have personally reviewed following labs  CBC: Recent Labs  Lab 08/31/21 0536 08/31/21 1202 08/31/21 1745  WBC 11.8*  --   --   NEUTROABS 9.0*  --   --   HGB 14.1 12.5* 12.8*  HCT 41.3 37.0* 37.3*  MCV 92.2  --   --   PLT 272  --   --    Basic Metabolic Panel: Recent Labs  Lab 08/31/21 0716  NA 137  K 4.0  CL 103  CO2 27  GLUCOSE 129*  BUN 27*  CREATININE 1.23  CALCIUM 9.2   GFR: Estimated Creatinine Clearance: 75 mL/min (by C-G formula based on SCr of 1.23 mg/dL).  Liver Function Tests: Recent Labs  Lab 08/31/21 0716  AST 40  ALT 32  ALKPHOS 61  BILITOT 0.8  PROT 7.2  ALBUMIN 3.7   Recent Labs  Lab 08/31/21 0716  LIPASE 52*   Urine analysis:    Component Value Date/Time   COLORURINE YELLOW (A) 08/31/2021 0943   APPEARANCEUR HAZY (A) 08/31/2021 [id number]  LABSPEC 1.014 08/31/2021 [place] 6.0 08/31/2021 0943   GLUCOSEU NEGATIVE 08/31/2021 0943   HGBUR NEGATIVE 08/31/2021 [place] 08/31/2021 [place] 08/31/2021 0943   PROTEINUR NEGATIVE 08/31/2021 0943   NITRITE NEGATIVE 08/31/2021 [place] 08/31/2021 0943   Dr. Tobie Poet [facility]  If 7PM-7AM, please contact overnight-coverage provider If 7AM-7PM, please contact day coverage provider [email or web address]  08/31/2021, 6:37 PM

## 2021-08-31 NOTE — Consult Note (Signed)
[facility] Consult Note  MRN : [id number]  Christian Mills is a 65 y.o. (1956/09/21) male who presents with chief complaint of  Chief Complaint  Patient presents with   Abdominal Pain   History of Present Illness:  The patient is a 65 year old male who presented to [facility] ED today for evaluation of abdominal pain and syncope.   Patient reports that in the preceding 2-3 days he had what he described as a burning sensation in his abdomen which was not exacerbated or relieved by anything. He had some nausea associated with this. He got up to go to the bathroom in the middle of the night and woke up on the floor.   He denied any CP, SOB, lightheadedness, or dizziness prior to the syncopal episode. He denied any fever, chills, emesis, diarrhea, or blood in his stool. No previous intra-abdominal surgeries. He is on 81 mg ASA but otherwise no anticoagulation.   Work up in the ED revealed a mild leukocytosis to 11.8K, Hgb 14.1, sCr in normal ranges at 1.23, no electrolyte derangements, and lactic acid level was normal at 1.2.   CTA Chest / ABD / Pelvis (10/31/20): Aorta: Aortic atherosclerosis. No aneurysmal dilation of the thoracic aorta. No sign of dissection or perivascular stranding about the abdominal aorta or iliac vessels. Celiac: Stranding about the celiac axis. 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 The splenic artery beyond this level is patent. Caliber of the celiac trunk approximately 12 mm. There is an accessory LEFT hepatic artery arising as a dedicated branch just above the celiac origin. Remaining hepatic vessels arising from the dissected celiac trunk are patent despite stranding surrounding the common hepatic artery, likely due to dissection propagation into this vessel. SMA: Widely patent without dilation, dissection or adjacent stranding. Renals: Early bifurcation of the RIGHT renal  artery, main renal artery with an accessory artery arising from the aorta to the upper pole as well. Early bifurcation of single LEFT renal artery. No perivascular stranding or signs of dissection of the renal vasculature. IMA: Patent without evidence of aneurysm, dissection, vasculitis or significant stenosis. Inflow: Patent without evidence of aneurysm, dissection, vasculitis or significant stenosis. Proximal outflow: Iliac vessels are patent. No stranding or signs of dissection in the iliac vasculature. Veins: Veins not well assessed on the current evaluation, normal caliber. Smooth contour of the IVC.  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  Current Facility-Administered Medications  Medication Dose Route Frequency Provider Last Rate Last Admin   acetaminophen (TYLENOL) tablet 650 mg  650 mg Oral Q6H PRN Cox, Amy N, DO       Or   acetaminophen (TYLENOL) suppository 650 mg  650 mg Rectal Q6H PRN Cox, Amy N, DO       atorvastatin (LIPITOR) tablet 80 mg  80 mg Oral Daily Cox, Amy N, DO       busPIRone (BUSPAR) tablet 15 mg  15 mg Oral TID Cox, Amy N, DO       heparin injection 5,000 Units  5,000 Units Subcutaneous Q8H Cox, Amy N, DO       lamoTRIgine (LAMICTAL) tablet 500 mg  500 mg Oral Daily Cox, Amy N, DO   500 mg at 08/31/21 1319   ondansetron (ZOFRAN) tablet 4 mg  4 mg Oral Q6H PRN Cox, Amy N, DO       Or   ondansetron (  ZOFRAN) injection 4 mg  4 mg Intravenous Q6H PRN Cox, Amy N, DO       pantoprazole (PROTONIX) EC tablet 80 mg  80 mg Oral Daily Cox, Amy N, DO   80 mg at 08/31/21 1319   traZODone (DESYREL) tablet 150 mg  150 mg Oral QHS PRN Cox, Amy N, DO       venlafaxine XR (EFFEXOR-XR) 24 hr capsule 300 mg  300 mg Oral Daily Cox, Amy N, DO       Current Outpatient Medications  Medication Sig Dispense Refill   Aspirin 81 MG CAPS Take 1 tablet by mouth daily.     atorvastatin (LIPITOR) 80 MG tablet Take 80 mg by mouth daily.      busPIRone (BUSPAR) 15 MG tablet Take 15 mg by mouth 3 (three) times daily.     Ciclopirox 1 % shampoo      ketoconazole (NIZORAL) 2 % shampoo Apply topically.     lamoTRIgine (LAMICTAL) 200 MG tablet Take 500 mg by mouth daily.     omeprazole (PRILOSEC) 40 MG capsule Take 40 mg by mouth daily.     pravastatin (PRAVACHOL) 40 MG tablet Take 1 tablet by mouth daily.     propranolol ER (INDERAL LA) 80 MG 24 hr capsule Take 80 mg by mouth daily.     Selenium Sulfide 2.25 % SHAM Apply topically.     tamsulosin (FLOMAX) 0.4 MG CAPS capsule Take 0.4 mg by mouth daily.     traZODone (DESYREL) 150 MG tablet Take 150-300 mg by mouth at bedtime as needed.     venlafaxine XR (EFFEXOR-XR) 150 MG 24 hr capsule Take 300 mg by mouth daily.     Vitamin D, Cholecalciferol, 10 MCG (400 UNIT) CAPS Take 2 capsules by mouth daily.     fenofibrate (TRICOR) 145 MG tablet Take 145 mg by mouth daily. (Patient not taking: Reported on 08/31/2021)     ranitidine (ZANTAC) 150 MG tablet Take 1 tablet by mouth daily. (Patient not taking: Reported on 08/31/2021)     History reviewed. No pertinent past medical history.  Social History Patient denies tobacco abuse, EtOH abuse, illicit drug use.  Family History Denies family history of peripheral artery disease, aneurysmal disease, venous disease, renal disease, or bleeding/clotting disorders.  No Known Allergies  REVIEW OF SYSTEMS (Negative unless checked)  Constitutional: [] Weight loss  [] Fever  [] Chills Cardiac: [] Chest pain   [] Chest pressure   [] Palpitations   [] Shortness of breath when laying flat   [] Shortness of breath at rest   [] Shortness of breath with exertion. Vascular:  [] Pain in legs with walking   [] Pain in legs at rest   [] Pain in legs when laying flat   [] Claudication   [] Pain in feet when walking  [] Pain in feet at rest  [] Pain in feet when laying flat   [] History of DVT   [] Phlebitis   [] Swelling in legs   [] Varicose veins   [] Non-healing  ulcers Pulmonary:   [] Uses home oxygen   [] Productive cough   [] Hemoptysis   [] Wheeze  [] COPD   [] Asthma Neurologic:  [] Dizziness  [] Blackouts   [] Seizures   [] History of stroke   [] History of TIA  [] Aphasia   [] Temporary blindness   [] Dysphagia   [] Weakness or numbness in arms   [] Weakness or numbness in legs Musculoskeletal:  [] Arthritis   [] Joint swelling   [] Joint pain   [] Low back pain Hematologic:  [] Easy bruising  [] Easy bleeding   [] Hypercoagulable state   []   Anemic  [] Hepatitis Gastrointestinal:  [] Blood in stool   [] Vomiting blood  [] Gastroesophageal reflux/heartburn   [] Difficulty swallowing. Genitourinary:  [] Chronic kidney disease   [] Difficult urination  [] Frequent urination  [] Burning with urination   [] Blood in urine Skin:  [] Rashes   [] Ulcers   [] Wounds Psychological:  [] History of anxiety   []  History of major depression.  Positive for abdominal pain and syncope  Physical Examination  Vitals:   08/31/21 1000 08/31/21 1101 08/31/21 1200 08/31/21 1300  BP: 117/75 113/73 111/74 106/72  Pulse: 97 93 91 93  Resp: 16 18 15 13   Temp:      TempSrc:      SpO2: 94% 95% 95% 93%  Weight:      Height:       Body mass index is 30.53 kg/m. Gen:  WD/WN, NAD Head: /AT, No temporalis wasting. Prominent temp pulse not noted. Ear/Nose/Throat: Hearing grossly intact, nares w/o erythema or drainage, oropharynx w/o Erythema/Exudate Eyes: Sclera non-icteric, conjunctiva clear Neck: Trachea midline.  No JVD.  Pulmonary:  Good air movement, respirations not labored, equal bilaterally.  Cardiac: RRR, normal S1, S2. Vascular:  Vessel Right Left  Radial Palpable Palpable  Ulnar Palpable Palpable   Gastrointestinal: soft, non-tender/non-distended. No guarding/reflex.  Musculoskeletal: M/S 5/5 throughout.  Extremities without ischemic changes.  No deformity or atrophy. No edema. Neurologic: Sensation grossly intact in extremities.  Symmetrical.  Speech is fluent. Motor exam as listed  above. Psychiatric: Judgment intact, Mood & affect appropriate for pt's clinical situation. Dermatologic: No rashes or ulcers noted.  No cellulitis or open wounds. Lymph : No Cervical, Axillary, or Inguinal lymphadenopathy.  CBC Lab Results  Component Value Date   WBC 11.8 (H) 08/31/2021   HGB 12.5 (L) 08/31/2021   HCT 37.0 (L) 08/31/2021   MCV 92.2 08/31/2021   PLT 272 08/31/2021   BMET    Component Value Date/Time   NA 137 08/31/2021 0716   K 4.0 08/31/2021 0716   CL 103 08/31/2021 0716   CO2 27 08/31/2021 0716   GLUCOSE 129 (H) 08/31/2021 0716   BUN 27 (H) 08/31/2021 0716   CREATININE 1.23 08/31/2021 0716   CALCIUM 9.2 08/31/2021 0716   GFRNONAA >60 08/31/2021 0716   Estimated Creatinine Clearance: 75 mL/min (by C-G formula based on SCr of 1.23 mg/dL).  COAG No results found for: INR, PROTIME  Radiology CT ABDOMEN PELVIS W CONTRAST  Result Date: 08/31/2021 CLINICAL DATA:  Abdominal pain for 3 days EXAM: CT ABDOMEN AND PELVIS WITH CONTRAST TECHNIQUE: Multidetector CT imaging of the abdomen and pelvis was performed using the standard protocol following bolus administration of intravenous contrast. CONTRAST:  117mL OMNIPAQUE IOHEXOL 300 MG/ML  SOLN COMPARISON:  None. FINDINGS: Lower chest: No acute abnormality. Scattered left coronary artery calcifications. Hepatobiliary: No solid liver abnormality is seen. Hepatic steatosis. No gallstones, gallbladder wall thickening, or biliary dilatation. Pancreas: Unremarkable. No pancreatic ductal dilatation or surrounding inflammatory changes. Spleen: Normal in size without significant abnormality. Adrenals/Urinary Tract: Adrenal glands are unremarkable. Kidneys are normal, without renal calculi, solid lesion, or hydronephrosis. Mild thickening of the urinary bladder wall. Stomach/Bowel: Stomach is within normal limits. Appendix appears normal. No evidence of bowel wall thickening, distention, or inflammatory changes. Vascular/Lymphatic:  There appears to be a dissection of the celiac axis near the origin, with mild dilatation of the vessel to approximately 1.2 cm in caliber and adjacent fat stranding (series 2, image 36, series 6, image 79). The splenic and common hepatic arteries are opacified distally. Note incidental  vascular variant with a separate origin of the left gastric artery superiorly. Aortic atherosclerosis. No enlarged abdominal or pelvic lymph nodes. Reproductive: No mass or other significant abnormality. Other: No abdominal wall hernia or abnormality. No abdominopelvic ascites. Musculoskeletal: No acute or significant osseous findings. IMPRESSION: 1. 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 The splenic and common hepatic arteries are opacified distally. Recommend arterial phase CT angiogram to further evaluate and vascular consultation. 2. Note incidental vascular variant anatomy of the celiac branches with a separate origin of the left gastric artery superiorly. 3. Hepatic steatosis. 4. Mild thickening of the urinary bladder wall, likely related to chronic outlet obstruction. 5. Coronary artery disease. Aortic Atherosclerosis (ICD10-I70.0). Electronically Signed   By: Jearld Lesch M.D.   On: 08/31/2021 09:17   CT Angio Chest/Abd/Pel for Dissection W and/or Wo Contrast  Result Date: 08/31/2021 CLINICAL DATA:  A 65 year old male presents with abdominal pain and suspected aortic dissection, follow-up evaluation from abdomen and pelvis CT which was acquired on August 31, 2021, earlier the same date. EXAM: CT ANGIOGRAPHY CHEST, ABDOMEN AND PELVIS TECHNIQUE: Non-contrast CT of the chest was initially obtained. Multidetector CT imaging through the chest, abdomen and pelvis was performed using the standard protocol during bolus administration of intravenous contrast. Multiplanar reconstructed images and MIPs  were obtained and reviewed to evaluate the vascular anatomy. CONTRAST:  OMNIPAQUE IOHEXOL 350 MG/ML SOLN COMPARISON:  Comparison is made with abdominal and pelvic CT of the same date. FINDINGS: CTA CHEST FINDINGS Cardiovascular: Noncontrast CT first obtained of the chest shows no signs of intramural hematoma or pericardial effusion. Ascending aortic caliber approximately 3.7 cm transverse dimension, when measured in the coronal plane the ascending aorta is approximately 3.6 cm greatest dimension. No signs of aortic dissection involving the thoracic aorta on this non gated evaluation. No sign of stranding adjacent to the aorta. Three-vessel branching pattern in the chest. Normal heart size without substantial pericardial effusion. Normal caliber of central pulmonary arteries which are not well assessed on this phase of contrast enhancement. Mediastinum/Nodes: No thoracic inlet, axillary, mediastinal or hilar adenopathy. Esophagus grossly normal. Lungs/Pleura: No effusion, no consolidation or pneumothorax. Airways are patent. Tiny LEFT lower lobe pulmonary nodule (image 114/6) 4 mm. Tiny LEFT lower lobe pulmonary nodule (image 106/6) 4 mm. Musculoskeletal: See below for full musculoskeletal detail. Ovoid low-density subcutaneous lesion on the first image of the study measures water density and 2.8 cm likely a sebaceous cyst. No chest wall lesion. See below for full musculoskeletal details. Review of the MIP images confirms the above findings. CTA ABDOMEN AND PELVIS FINDINGS VASCULAR Aorta: Aortic atherosclerosis. No aneurysmal dilation of the thoracic aorta. No sign of dissection or perivascular stranding about the abdominal aorta or iliac vessels. Celiac: Stranding about the celiac axis. 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 The splenic artery beyond this level is patent. Caliber of the celiac trunk approximately 12 mm. There is an accessory LEFT  hepatic artery arising as a dedicated branch just above the celiac origin. Remaining hepatic vessels arising from the dissected celiac trunk are patent despite stranding surrounding the common hepatic artery, likely due to dissection propagation into this vessel. SMA: Widely patent without dilation, dissection or adjacent stranding. Renals: Early bifurcation of the RIGHT renal artery, main renal artery with an accessory artery arising from the aorta  to the upper pole as well. Early bifurcation of single LEFT renal artery. No perivascular stranding or signs of dissection of the renal vasculature. IMA: Patent without evidence of aneurysm, dissection, vasculitis or significant stenosis. Inflow: Patent without evidence of aneurysm, dissection, vasculitis or significant stenosis. Proximal outflow: Iliac vessels are patent. No stranding or signs of dissection in the iliac vasculature. Veins: Veins not well assessed on the current evaluation, normal caliber. Smooth contour of the IVC. Review of the MIP images confirms the above findings. NON-VASCULAR Hepatobiliary: Signs of hepatic steatosis. No perihepatic stranding. Trace amount of fluid along the inferior margin of the RIGHT hemi liver, too small to measure density values in this location. No focal hepatic lesion no pericholecystic stranding or sign of biliary duct distension. Pancreas: Normal, without mass, inflammation or ductal dilatation. Spleen: Top normal splenic size. No current signs of gross. Splenic infarct Adrenals/Urinary Tract: Normal adrenal glands. Cysts in the bilateral kidneys. Intermediate density arises from the lower pole of the LEFT kidney which is indeterminate based on density values potentially hemorrhagic cyst measuring 16 mm (image 136/5) lateral lower pole of the LEFT kidney. Small diverticula along the LEFT urinary bladder. No hydronephrosis. Stomach/Bowel: Bowel enhancement is preserved. No pneumatosis. Normal appendix. Mild jejunal  mesenteric stranding is suggested. No focal bowel wall thickening. Lymphatic: No adenopathy in the abdomen or in the pelvis. Reproductive: Unremarkable on CT. Other: Fluid in the LEFT pericolic gutter with density value of 37-47 Hounsfield units may be slightly increased compared to the previous CT evaluation remaining very small volume. This measures approximately 1.8 x 0.9 cm and was previously not measurable similarly with small amount of fluid anterior to the urinary bladder 38 Hounsfield units (image 187/5). Small to moderate bilateral fat containing inguinal hernias LEFT greater than RIGHT. No free air. Musculoskeletal: No acute musculoskeletal finding or destructive bone lesion. Spinal degenerative changes. Review of the MIP images confirms the above findings. 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 Visceral branches arising from the dissected celiac trunk remain opacified with contrast despite extensive stranding and signs of dissection. Accessory LEFT hepatic artery as described. Slight increase in fluid in the LEFT lower quadrant measures density that could be seen in the setting of small volume pneumoperitoneum. Perhaps this somehow relates to recent iodinated contrast administration and delayed enhancement of ascitic fluid. Occult bowel injury can present with isolated hemoperitoneum as well though this is not favored as there is no sign of focal bowel thickening or other findings to suggest trauma. No visible solid organ injury is demonstrated. I Mild jejunal mesenteric stranding. In the setting of visceral arterial dissection would suggest lactate correlation. This may also be helpful in light of above findings of potential small volume hemoperitoneum. Indeterminate potentially cystic lesion arising from the lower pole the LEFT kidney may represent a hemorrhagic cyst. Consider follow-up renal sonogram in this an initial means of further evaluating this  abnormality on a nonemergent basis to exclude a solid renal lesion. Multiple pulmonary nodules. Most severe: 4 mm left solid pulmonary nodule. No routine follow-up imaging is recommended, particularly in low risk patient population per [place]. while these guidelines do not apply to immunocompromised patients and patients with cancer. Follow up in patients with significant comorbidities as clinically warranted. For lung cancer screening, adhere to Lung-RADS guidelines. Reference: Radiology. 2017; 284(1):228-43. Electronically Signed   By: Zetta Bills M.D.   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  Celiac artery dissection: Recommend undergoing a mesenteric angiogram and attempt assess the patient's anatomy as well as repair his celiac artery dissection via a stent placement.  Procedure, risks and benefits were explained to the patient.  All questions were answered.  We will plan on this tomorrow with Dr. Delana Meyer.  NPO after midnight.  We will preop.  2.  Syncope: Unsure of etiology  Will need work-up  3.  Pericolic gutter fluid: General surgery has been consulted At this time, they do not recommend any type of intervention  Discussed with Dr. Francene Castle, PA-C 08/31/2021 1:44 PM  This note was created with Dragon medical transcription system.  Any error is purely unintentional.

## 2021-08-31 NOTE — ED Notes (Signed)
Informed RN bed assigned 

## 2021-08-31 NOTE — ED Notes (Signed)
Repeat labs sent.

## 2021-08-31 NOTE — ED Notes (Signed)
Repeat trop sent to lab

## 2021-09-01 ENCOUNTER — Encounter
Admission: EM | Discharge status: Against Medical Advice | Payer: Self-pay | Source: Home / Self Care | Attending: Internal Medicine

## 2021-09-01 DIAGNOSIS — F419 Anxiety disorder, unspecified: Secondary | ICD-10-CM | POA: Diagnosis present

## 2021-09-01 DIAGNOSIS — K760 Fatty (change of) liver, not elsewhere classified: Secondary | ICD-10-CM | POA: Diagnosis present

## 2021-09-01 DIAGNOSIS — E785 Hyperlipidemia, unspecified: Secondary | ICD-10-CM | POA: Diagnosis present

## 2021-09-01 DIAGNOSIS — K219 Gastro-esophageal reflux disease without esophagitis: Secondary | ICD-10-CM | POA: Diagnosis present

## 2021-09-01 DIAGNOSIS — Z20822 Contact with and (suspected) exposure to covid-19: Secondary | ICD-10-CM | POA: Diagnosis present

## 2021-09-01 DIAGNOSIS — R911 Solitary pulmonary nodule: Secondary | ICD-10-CM | POA: Diagnosis present

## 2021-09-01 DIAGNOSIS — G4700 Insomnia, unspecified: Secondary | ICD-10-CM | POA: Diagnosis present

## 2021-09-01 DIAGNOSIS — Z8249 Family history of ischemic heart disease and other diseases of the circulatory system: Secondary | ICD-10-CM | POA: Diagnosis not present

## 2021-09-01 DIAGNOSIS — Z5321 Procedure and treatment not carried out due to patient leaving prior to being seen by health care provider: Secondary | ICD-10-CM | POA: Diagnosis present

## 2021-09-01 DIAGNOSIS — Z7982 Long term (current) use of aspirin: Secondary | ICD-10-CM | POA: Diagnosis not present

## 2021-09-01 DIAGNOSIS — Z79899 Other long term (current) drug therapy: Secondary | ICD-10-CM | POA: Diagnosis not present

## 2021-09-01 DIAGNOSIS — I7779 Dissection of other artery: Secondary | ICD-10-CM | POA: Diagnosis present

## 2021-09-01 DIAGNOSIS — F32A Depression, unspecified: Secondary | ICD-10-CM | POA: Diagnosis present

## 2021-09-01 DIAGNOSIS — I748 Embolism and thrombosis of other arteries: Secondary | ICD-10-CM | POA: Diagnosis present

## 2021-09-01 DIAGNOSIS — I10 Essential (primary) hypertension: Secondary | ICD-10-CM | POA: Diagnosis present

## 2021-09-01 LAB — BASIC METABOLIC PANEL
Anion gap: 5 (ref 5–15)
BUN: 26 mg/dL — ABNORMAL HIGH (ref 8–23)
CO2: 26 mmol/L (ref 22–32)
Calcium: 9 mg/dL (ref 8.9–10.3)
Chloride: 105 mmol/L (ref 98–111)
Creatinine, Ser: 1.34 mg/dL — ABNORMAL HIGH (ref 0.61–1.24)
GFR, Estimated: 59 mL/min — ABNORMAL LOW (ref >60)
Glucose, Bld: 132 mg/dL — ABNORMAL HIGH (ref 70–99)
Potassium: 3.9 mmol/L (ref 3.5–5.1)
Sodium: 136 mmol/L (ref 135–145)

## 2021-09-01 LAB — CBC
HCT: 34.3 % — ABNORMAL LOW (ref 39.0–52.0)
Hemoglobin: 11.3 g/dL — ABNORMAL LOW (ref 13.0–17.0)
MCH: 30.9 pg (ref 26.0–34.0)
MCHC: 32.9 g/dL (ref 30.0–36.0)
MCV: 93.7 fL (ref 80.0–100.0)
Platelets: 234 10*3/uL (ref 150–400)
RBC: 3.66 MIL/uL — ABNORMAL LOW (ref 4.22–5.81)
RDW: 13.2 % (ref 11.5–15.5)
WBC: 7.2 10*3/uL (ref 4.0–10.5)
nRBC: 0 % (ref 0.0–0.2)

## 2021-09-01 LAB — HEMOGLOBIN AND HEMATOCRIT, BLOOD
HCT: 35.3 % — ABNORMAL LOW (ref 39.0–52.0)
Hemoglobin: 12 g/dL — ABNORMAL LOW (ref 13.0–17.0)

## 2021-09-01 SURGERY — VISCERAL ANGIOGRAPHY
Anesthesia: Moderate Sedation

## 2021-09-01 MED ORDER — SODIUM CHLORIDE 0.9 % IV SOLN: INTRAVENOUS | Status: DC

## 2021-09-01 MED ORDER — HYDROMORPHONE HCL 1 MG/ML IJ SOLN: 1.0000 mg | Freq: Once | INTRAMUSCULAR | Status: DC | PRN

## 2021-09-01 MED ORDER — ONDANSETRON HCL 4 MG/2ML IJ SOLN: 4.0000 mg | Freq: Four times a day (QID) | INTRAMUSCULAR | Status: DC | PRN

## 2021-09-01 MED ORDER — METHYLPREDNISOLONE SODIUM SUCC 125 MG IJ SOLR: 125.0000 mg | Freq: Once | INTRAMUSCULAR | Status: DC | PRN

## 2021-09-01 MED ORDER — CEFAZOLIN SODIUM-DEXTROSE 2-4 GM/100ML-% IV SOLN
INTRAVENOUS | Status: AC
  Filled 2021-09-01: qty 100

## 2021-09-01 MED ORDER — MIDAZOLAM HCL 2 MG/ML PO SYRP
8.0000 mg | ORAL_SOLUTION | Freq: Once | ORAL | Status: DC | PRN
  Filled 2021-09-01: qty 4

## 2021-09-01 MED ORDER — DIPHENHYDRAMINE HCL 50 MG/ML IJ SOLN: 50.0000 mg | Freq: Once | INTRAMUSCULAR | Status: DC | PRN

## 2021-09-01 MED ORDER — CEFAZOLIN SODIUM-DEXTROSE 2-4 GM/100ML-% IV SOLN
2.0000 g | Freq: Once | INTRAVENOUS | Status: DC
  Filled 2021-09-01: qty 100

## 2021-09-01 MED ORDER — FAMOTIDINE 20 MG PO TABS: 40.0000 mg | ORAL_TABLET | Freq: Once | ORAL | Status: DC | PRN

## 2021-09-01 NOTE — Discharge Summary (Addendum)
Physician [facility] Discharge Summary   Christian Mills  male DOB: 04-12-1956  MC:[id number]  PCP: [facility]  Admit date: 08/31/2021 Discharge date: 09/01/2021  Admitted From: home Disposition:  Left AMA CODE STATUS: Full code   Hospital Course:  For full details, please see H&P, progress notes, consult notes and ancillary notes.  Briefly,  Christian Mills is a 65 y.o. male with medical history significant for hyperlipidemia, depression, GERD, hypertension who presented with syncope after waking up from sleep with burning sensation in his abdomen.    # Celiac artery dissection with thrombosis  - Vascular surgery consulted and had planned a mesenteric angiogram and attempt assess the patient's anatomy as well as repair his celiac artery dissection via a stent placement.  While waiting for the procedure, pt abruptly became aggressive and verbally abusive to staff.  Per RN from [facility], pt said he was leaving, and signed AMA paper and walked out.   # Depression  # Psychiatric imbalance and anxiety  # Left lower lobe nodule-4 mm, does not have risk factors for carcinoma as he denies adamantly that he does not smoke or use tobacco products and denies known history of carcinoma  # Insomnia  # Hypertension   # Hyperlipidemia  # GERD  # CKD ruled out   Discharge Diagnoses:  Principal Problem:   Celiac artery dissection (HCC) Active Problems:   Depression   Insomnia   Hyperlipidemia   Hepatic steatosis   Leukocytosis   30 Day Unplanned Readmission Risk Score    Flowsheet Row ED to Hosp-Admission (Current) from 08/31/2021 in [place]  30 Day Unplanned Readmission Risk Score (%) 12.05 Filed at 09/01/2021 1600       This score is the patient's risk of an unplanned readmission within 30 days of being discharged (0 -100%). The score is based on dignosis, age, lab data, medications, orders, and past utilization.   Low:   0-14.9   Medium: 15-21.9   High: 22-29.9   Extreme: 30 and above         Discharge Instructions:  Med Rec not reconciled, since pt left AMA.   Follow-up Information     Schnier, Dolores Lory, MD Follow up in 1 month(s).   Specialties: Vascular Surgery, Cardiology, Radiology, Vascular Surgery Why: Consult.  Celiac dissection.  Will need mesenteric duplex with visit. Contact information: [place] [address] [phone]                 No Known Allergies   The results of significant diagnostics from this hospitalization (including imaging, microbiology, ancillary and laboratory) are listed below for reference.   Consultations:   Procedures/Studies: CT ABDOMEN PELVIS W CONTRAST  Result Date: 08/31/2021 CLINICAL DATA:  Abdominal pain for 3 days EXAM: CT ABDOMEN AND PELVIS WITH CONTRAST TECHNIQUE: Multidetector CT imaging of the abdomen and pelvis was performed using the standard protocol following bolus administration of intravenous contrast. CONTRAST:  12mL OMNIPAQUE IOHEXOL 300 MG/ML  SOLN COMPARISON:  None. FINDINGS: Lower chest: No acute abnormality. Scattered left coronary artery calcifications. Hepatobiliary: No solid liver abnormality is seen. Hepatic steatosis. No gallstones, gallbladder wall thickening, or biliary dilatation. Pancreas: Unremarkable. No pancreatic ductal dilatation or surrounding inflammatory changes. Spleen: Normal in size without significant abnormality. Adrenals/Urinary Tract: Adrenal glands are unremarkable. Kidneys are normal, without renal calculi, solid lesion, or hydronephrosis. Mild thickening of the urinary bladder wall. Stomach/Bowel: Stomach is within normal limits. Appendix appears normal. No evidence of  bowel wall thickening, distention, or inflammatory changes. Vascular/Lymphatic: There appears to be a dissection of the celiac axis near the origin, with mild dilatation of the vessel to approximately 1.2 cm in caliber and  adjacent fat stranding (series 2, image 36, series 6, image 79). The splenic and common hepatic arteries are opacified distally. Note incidental vascular variant with a separate origin of the left gastric artery superiorly. Aortic atherosclerosis. No enlarged abdominal or pelvic lymph nodes. Reproductive: No mass or other significant abnormality. Other: No abdominal wall hernia or abnormality. No abdominopelvic ascites. Musculoskeletal: No acute or significant osseous findings. IMPRESSION: 1. 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 The splenic and common hepatic arteries are opacified distally. Recommend arterial phase CT angiogram to further evaluate and vascular consultation. 2. Note incidental vascular variant anatomy of the celiac branches with a separate origin of the left gastric artery superiorly. 3. Hepatic steatosis. 4. Mild thickening of the urinary bladder wall, likely related to chronic outlet obstruction. 5. Coronary artery disease. Aortic Atherosclerosis (ICD10-I70.0). Electronically Signed   By: Delanna Ahmadi M.D.   On: 08/31/2021 09:17   CT Angio Chest/Abd/Pel for Dissection W and/or Wo Contrast  Addendum Date: 08/31/2021   ADDENDUM REPORT: 08/31/2021 15:01 ADDENDUM: Dissection of the celiac with partial thrombosis of the vessel lumen as well as higher density fluid in the abdomen as discussed in the initial report were called by telephone at the time of interpretation on 08/31/2021 at 11:15 AM to provider [facility] , who verbally acknowledged these results. Electronically Signed   By: Zetta Bills M.D.   On: 08/31/2021 15:01   Result Date: 08/31/2021 CLINICAL DATA:  A 65 year old male presents with abdominal pain and suspected aortic dissection, follow-up evaluation from abdomen and pelvis CT which was acquired on August 31, 2021, earlier the same date. EXAM: CT  ANGIOGRAPHY CHEST, ABDOMEN AND PELVIS TECHNIQUE: Non-contrast CT of the chest was initially obtained. Multidetector CT imaging through the chest, abdomen and pelvis was performed using the standard protocol during bolus administration of intravenous contrast. Multiplanar reconstructed images and MIPs were obtained and reviewed to evaluate the vascular anatomy. CONTRAST:  172mL OMNIPAQUE IOHEXOL 350 MG/ML SOLN COMPARISON:  Comparison is made with abdominal and pelvic CT of the same date. FINDINGS: CTA CHEST FINDINGS Cardiovascular: Noncontrast CT first obtained of the chest shows no signs of intramural hematoma or pericardial effusion. Ascending aortic caliber approximately 3.7 cm transverse dimension, when measured in the coronal plane the ascending aorta is approximately 3.6 cm greatest dimension. No signs of aortic dissection involving the thoracic aorta on this non gated evaluation. No sign of stranding adjacent to the aorta. Three-vessel branching pattern in the chest. Normal heart size without substantial pericardial effusion. Normal caliber of central pulmonary arteries which are not well assessed on this phase of contrast enhancement. Mediastinum/Nodes: No thoracic inlet, axillary, mediastinal or hilar adenopathy. Esophagus grossly normal. Lungs/Pleura: No effusion, no consolidation or pneumothorax. Airways are patent. Tiny LEFT lower lobe pulmonary nodule (image 114/6) 4 mm. Tiny LEFT lower lobe pulmonary nodule (image 106/6) 4 mm. Musculoskeletal: See below for full musculoskeletal detail. Ovoid low-density subcutaneous lesion on the first image of the study measures water density and 2.8 cm likely a sebaceous cyst. No chest wall lesion. See below for full musculoskeletal details. Review of the MIP images confirms the above findings. CTA ABDOMEN AND PELVIS FINDINGS VASCULAR Aorta:  Aortic atherosclerosis. No aneurysmal dilation of the thoracic aorta. No sign of dissection or perivascular stranding about the  abdominal aorta or iliac vessels. Celiac: Stranding about the celiac axis. 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 The splenic artery beyond this level is patent. Caliber of the celiac trunk approximately 12 mm. There is an accessory LEFT hepatic artery arising as a dedicated branch just above the celiac origin. Remaining hepatic vessels arising from the dissected celiac trunk are patent despite stranding surrounding the common hepatic artery, likely due to dissection propagation into this vessel. SMA: Widely patent without dilation, dissection or adjacent stranding. Renals: Early bifurcation of the RIGHT renal artery, main renal artery with an accessory artery arising from the aorta to the upper pole as well. Early bifurcation of single LEFT renal artery. No perivascular stranding or signs of dissection of the renal vasculature. IMA: Patent without evidence of aneurysm, dissection, vasculitis or significant stenosis. Inflow: Patent without evidence of aneurysm, dissection, vasculitis or significant stenosis. Proximal outflow: Iliac vessels are patent. No stranding or signs of dissection in the iliac vasculature. Veins: Veins not well assessed on the current evaluation, normal caliber. Smooth contour of the IVC. Review of the MIP images confirms the above findings. NON-VASCULAR Hepatobiliary: Signs of hepatic steatosis. No perihepatic stranding. Trace amount of fluid along the inferior margin of the RIGHT hemi liver, too small to measure density values in this location. No focal hepatic lesion no pericholecystic stranding or sign of biliary duct distension. Pancreas: Normal, without mass, inflammation or ductal dilatation. Spleen: Top normal splenic size. No current signs of gross. Splenic infarct Adrenals/Urinary Tract: Normal adrenal glands. Cysts in the bilateral kidneys. Intermediate density arises from the lower pole of the LEFT kidney which is  indeterminate based on density values potentially hemorrhagic cyst measuring 16 mm (image 136/5) lateral lower pole of the LEFT kidney. Small diverticula along the LEFT urinary bladder. No hydronephrosis. Stomach/Bowel: Bowel enhancement is preserved. No pneumatosis. Normal appendix. Mild jejunal mesenteric stranding is suggested. No focal bowel wall thickening. Lymphatic: No adenopathy in the abdomen or in the pelvis. Reproductive: Unremarkable on CT. Other: Fluid in the LEFT pericolic gutter with density value of 37-47 Hounsfield units may be slightly increased compared to the previous CT evaluation remaining very small volume. This measures approximately 1.8 x 0.9 cm and was previously not measurable similarly with small amount of fluid anterior to the urinary bladder 38 Hounsfield units (image 187/5). Small to moderate bilateral fat containing inguinal hernias LEFT greater than RIGHT. No free air. Musculoskeletal: No acute musculoskeletal finding or destructive bone lesion. Spinal degenerative changes. Review of the MIP images confirms the above findings. 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 Visceral branches arising from the dissected celiac trunk remain opacified with contrast despite extensive stranding and signs of dissection. Accessory LEFT hepatic artery as described. Slight increase in fluid in the LEFT lower quadrant measures density that could be seen in the setting of small volume pneumoperitoneum. Perhaps this somehow relates to recent iodinated contrast administration and delayed enhancement of ascitic fluid. Occult bowel injury can present with isolated hemoperitoneum as well though this is not favored as there is no sign of focal bowel thickening or other findings to suggest trauma. No visible solid organ injury is demonstrated. I Mild jejunal mesenteric stranding. In the setting of visceral arterial dissection would suggest lactate correlation.  This may also be helpful in light of  above findings of potential small volume hemoperitoneum. Indeterminate potentially cystic lesion arising from the lower pole the LEFT kidney may represent a hemorrhagic cyst. Consider follow-up renal sonogram in this an initial means of further evaluating this abnormality on a nonemergent basis to exclude a solid renal lesion. Multiple pulmonary nodules. Most severe: 4 mm left solid pulmonary nodule. No routine follow-up imaging is recommended, particularly in low risk patient population per [place]. while these guidelines do not apply to immunocompromised patients and patients with cancer. Follow up in patients with significant comorbidities as clinically warranted. For lung cancer screening, adhere to Lung-RADS guidelines. Reference: Radiology. 2017; 284(1):228-43. Electronically Signed: By: Zetta Bills M.D. On: 08/31/2021 11:26      Labs: BNP (last 3 results) No results for input(s): BNP in the last 8760 hours. Basic Metabolic Panel: Recent Labs  Lab 08/31/21 0716 09/01/21 0515  NA 137 136  K 4.0 3.9  CL 103 105  CO2 27 26  GLUCOSE 129* 132*  BUN 27* 26*  CREATININE 1.23 1.34*  CALCIUM 9.2 9.0   Liver Function Tests: Recent Labs  Lab 08/31/21 0716  AST 40  ALT 32  ALKPHOS 61  BILITOT 0.8  PROT 7.2  ALBUMIN 3.7   Recent Labs  Lab 08/31/21 0716  LIPASE 52*   No results for input(s): AMMONIA in the last 168 hours. CBC: Recent Labs  Lab 08/31/21 0536 08/31/21 1202 08/31/21 1745 08/31/21 2342 09/01/21 0515 09/01/21 1110  WBC 11.8*  --   --   --  7.2  --   NEUTROABS 9.0*  --   --   --   --   --   HGB 14.1 12.5* 12.8* 11.4* 11.3* 12.0*  HCT 41.3 37.0* 37.3* 33.6* 34.3* 35.3*  MCV 92.2  --   --   --  93.7  --   PLT 272  --   --   --  234  --    Cardiac Enzymes: No results for input(s): CKTOTAL, CKMB, CKMBINDEX, TROPONINI in the last 168 hours. BNP: Invalid input(s): POCBNP CBG: No results for input(s):  GLUCAP in the last 168 hours. D-Dimer No results for input(s): DDIMER in the last 72 hours. Hgb A1c No results for input(s): HGBA1C in the last 72 hours. Lipid Profile No results for input(s): CHOL, HDL, LDLCALC, TRIG, CHOLHDL, LDLDIRECT in the last 72 hours. Thyroid function studies No results for input(s): TSH, T4TOTAL, T3FREE, THYROIDAB in the last 72 hours.  Invalid input(s): FREET3 Anemia work up No results for input(s): VITAMINB12, FOLATE, FERRITIN, TIBC, IRON, RETICCTPCT in the last 72 hours. Urinalysis    Component Value Date/Time   COLORURINE YELLOW (A) 08/31/2021 0943   APPEARANCEUR HAZY (A) 08/31/2021 0943   LABSPEC 1.014 08/31/2021 0943   PHURINE 6.0 08/31/2021 0943   GLUCOSEU NEGATIVE 08/31/2021 0943   HGBUR NEGATIVE 08/31/2021 0943   BILIRUBINUR NEGATIVE 08/31/2021 0943   KETONESUR NEGATIVE 08/31/2021 0943   PROTEINUR NEGATIVE 08/31/2021 0943   NITRITE NEGATIVE 08/31/2021 0943   LEUKOCYTESUR NEGATIVE 08/31/2021 0943   Sepsis Labs Invalid input(s): PROCALCITONIN,  WBC,  LACTICIDVEN Microbiology Recent Results (from the past 240 hour(s))  Resp Panel by RT-PCR (Flu A&B, Covid) Nasopharyngeal Swab     Status: None   Collection Time: 08/31/21 12:02 PM   Specimen: Nasopharyngeal Swab; Nasopharyngeal(NP) swabs in vial transport medium  Result Value Ref Range Status   SARS Coronavirus 2 by RT PCR NEGATIVE NEGATIVE Final    Comment: (NOTE) SARS-CoV-2 target nucleic acids are  NOT DETECTED.  The SARS-CoV-2 RNA is generally detectable in upper respiratory specimens during the acute phase of infection. The lowest concentration of SARS-CoV-2 viral copies this assay can detect is 138 copies/mL. A negative result does not preclude SARS-Cov-2 infection and should not be used as the sole basis for treatment or other patient management decisions. A negative result may occur with  improper specimen collection/handling, submission of specimen other than nasopharyngeal swab,  presence of viral mutation(s) within the areas targeted by this assay, and inadequate number of viral copies(<138 copies/mL). A negative result must be combined with clinical observations, patient history, and epidemiological information. The expected result is Negative.  Fact Sheet for Patients:  EntrepreneurPulse.com.au  Fact Sheet for Healthcare Providers:  IncredibleEmployment.be  This test is no t yet approved or cleared by the [place] FDA and  has been authorized for detection and/or diagnosis of SARS-CoV-2 by FDA under an Emergency Use Authorization (EUA). This EUA will remain  in effect (meaning this test can be used) for the duration of the COVID-19 declaration under Section 564(b)(1) of the Act, 21 U.S.C.section 360bbb-3(b)(1), unless the authorization is terminated  or revoked sooner.       Influenza A by PCR NEGATIVE NEGATIVE Final   Influenza B by PCR NEGATIVE NEGATIVE Final    Comment: (NOTE) The Xpert Xpress SARS-CoV-2/FLU/RSV plus assay is intended as an aid in the diagnosis of influenza from Nasopharyngeal swab specimens and should not be used as a sole basis for treatment. Nasal washings and aspirates are unacceptable for Xpert Xpress SARS-CoV-2/FLU/RSV testing.  Fact Sheet for Patients: EntrepreneurPulse.com.au  Fact Sheet for Healthcare Providers: IncredibleEmployment.be  This test is not yet approved or cleared by the [place] FDA and has been authorized for detection and/or diagnosis of SARS-CoV-2 by FDA under an Emergency Use Authorization (EUA). This EUA will remain in effect (meaning this test can be used) for the duration of the COVID-19 declaration under Section 564(b)(1) of the Act, 21 U.S.C. section 360bbb-3(b)(1), unless the authorization is terminated or revoked.  Performed at [facility], [address] [place], [place] [address]     Pt was  seen during rounds. Constitutional: NAD, AAOx3 HEENT: conjunctivae and lids normal, EOMI CV: No cyanosis.   RESP: normal respiratory effort, on RA Extremities: No effusions, edema in BLE SKIN: warm, dry Neuro: II - XII grossly intact.   Psych: Normal mood and affect at the time during rounds.  Billed as level 1 note, since I didn't discharge this pt.   Enzo Bi, MD  [facility] 09/01/2021, 4:13 PM

## 2021-09-01 NOTE — Progress Notes (Signed)
Patient brought to special procedures for procedure, patient prepped by RN and was ready for procedure.  Patient proceeded to escalate to aggressive behavior and verbally abusive to staff.  Patient said he was leaving and proceeded to remove his ekg electrodes, iv, monitoring equipment, signed AMA paperwork, wrapped blanket around him and walked out of unit.  He stated he was going to his room to get his things and was leaving.   Dr. Darlin Priestly and Dr. Vilinda Flake notified of the above.

## 2021-09-01 NOTE — Progress Notes (Addendum)
Received call from Special Procedures RN informing this RN that patient is leaving AMA.  Charge RN said the patient already came, picked up his belongings from his room and left.  Dr. Fran Lowes made aware.

## 2022-07-09 IMAGING — CT CT ANGIO CHEST-ABD-PELV FOR DISSECTION W/ AND WO/W CM
1 of 8 series · 8 of 46 positions shown, 9 images · IV contrast (APPLIED)
Comparison: Comparison is made with abdominal and pelvic CT of the
same date.
COMPARISON: Comparison is made with abdominal and pelvic CT of the
same date.

Addendum:
CLINICAL DATA: A 65-year-old male presents with abdominal pain and
suspected aortic dissection, follow-up evaluation from abdomen and
pelvis CT which was acquired on August 31, 2021, earlier the same
date.

EXAM:
CT ANGIOGRAPHY CHEST, ABDOMEN AND PELVIS
TECHNIQUE: Non-contrast CT of the chest was initially obtained.

[Series 12: arterial thins correct · axial · arterial · 0.89mm/px · z∈[-798,-218]mm · 8 of 907 slices shown, 9 images]
[im 91/907  soft-tissue]
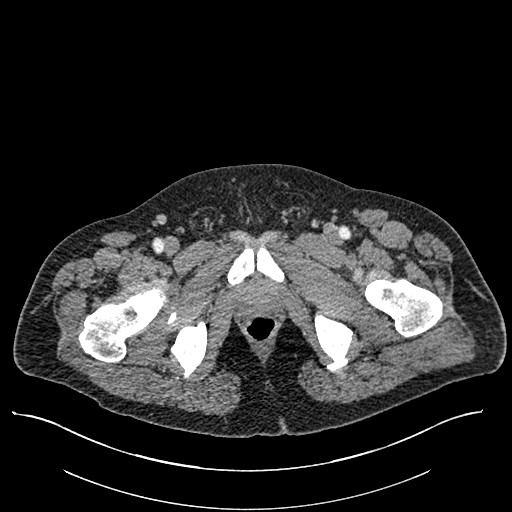
[im 91/907  bone]
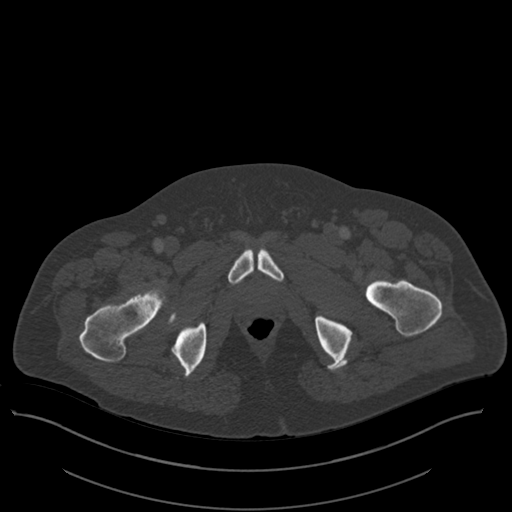
[im 182/907  soft-tissue]
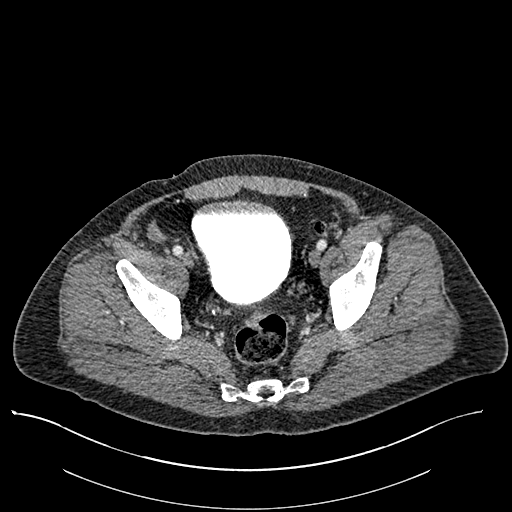
[im 272/907  soft-tissue]
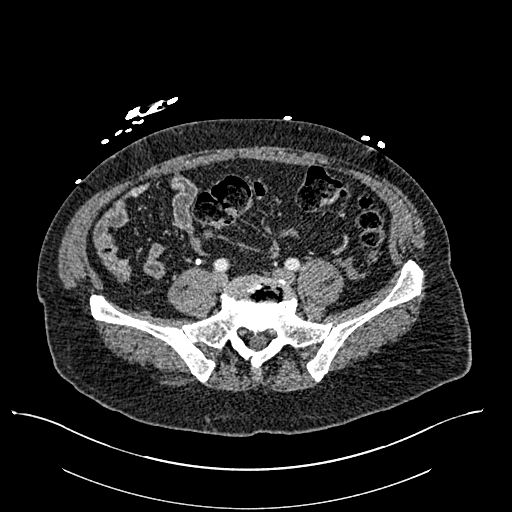
[im 408/907  soft-tissue]
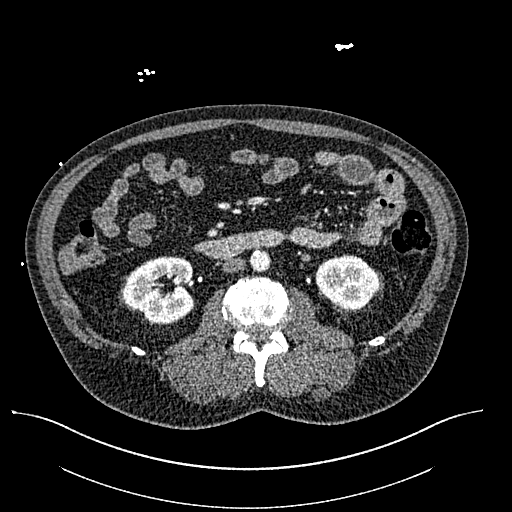
[im 499/907  soft-tissue]
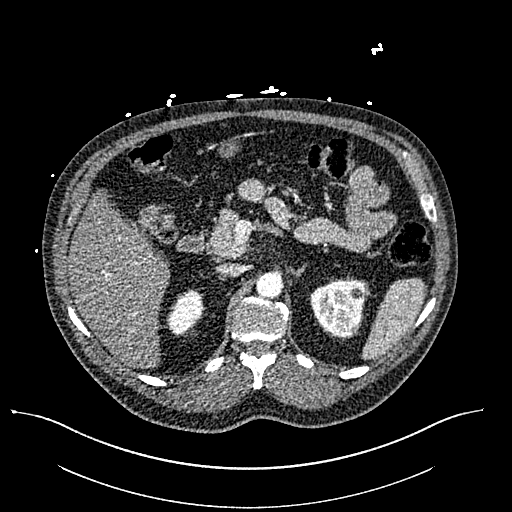
[im 635/907  soft-tissue]
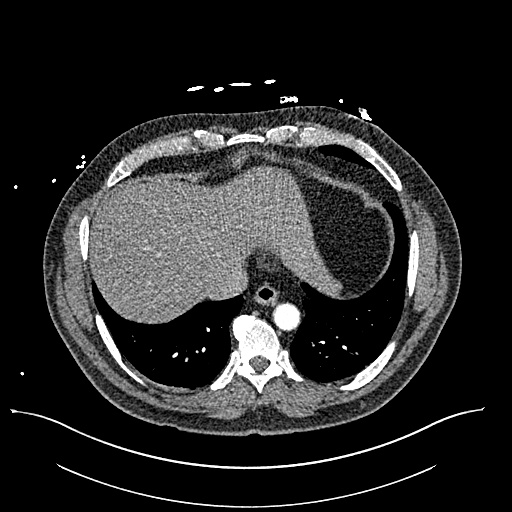
[im 725/907  soft-tissue]
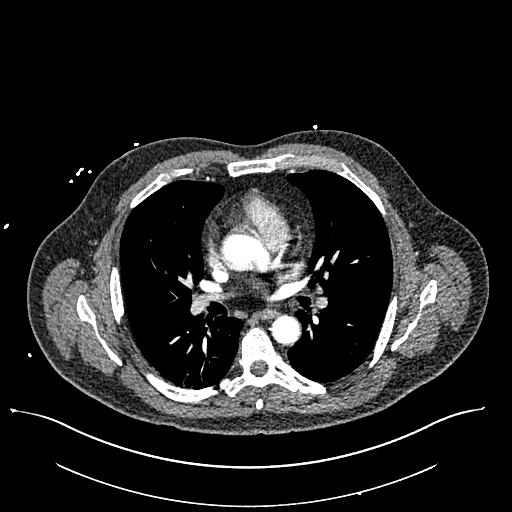
[im 816/907  soft-tissue]
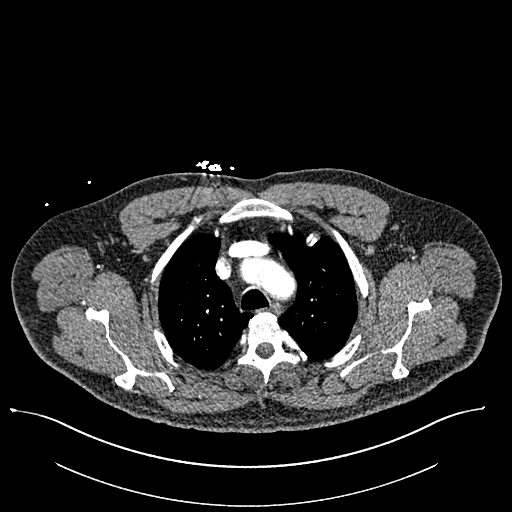

[8 of 46 positions shown; findings below may reference images not displayed]

Multidetector CT imaging through the chest, abdomen and pelvis was
performed using the standard protocol during bolus administration of
intravenous contrast. Multiplanar reconstructed images and MIPs were
obtained and reviewed to evaluate the vascular anatomy.

CONTRAST:  100mL OMNIPAQUE IOHEXOL 350 MG/ML SOLN
FINDINGS: CTA CHEST FINDINGS

Cardiovascular: Noncontrast CT first obtained of the chest shows no
signs of intramural hematoma or pericardial effusion. Ascending
aortic caliber approximately 3.7 cm transverse dimension, when
measured in the coronal plane the ascending aorta is approximately
3.6 cm greatest dimension. No signs of aortic dissection involving
the thoracic aorta on this non gated evaluation.

No sign of stranding adjacent to the aorta. Three-vessel branching
pattern in the chest. Normal heart size without substantial
pericardial effusion. Normal caliber of central pulmonary arteries
which are not well assessed on this phase of contrast enhancement.

Mediastinum/Nodes: No thoracic inlet, axillary, mediastinal or hilar
adenopathy. Esophagus grossly normal.

Lungs/Pleura: No effusion, no consolidation or pneumothorax. Airways
are patent. Tiny LEFT lower lobe pulmonary nodule (image 114/6) 4
mm.

Tiny LEFT lower lobe pulmonary nodule (image 106/6) 4 mm.

Musculoskeletal: See below for full musculoskeletal detail. Ovoid
low-density subcutaneous lesion on the first image of the study
measures water density and 2.8 cm likely a sebaceous cyst. No chest
wall lesion. See below for full musculoskeletal details.

Review of the MIP images confirms the above findings.

CTA ABDOMEN AND PELVIS FINDINGS

VASCULAR

Aorta: Aortic atherosclerosis. No aneurysmal dilation of the
thoracic aorta. No sign of dissection or perivascular stranding
about the abdominal aorta or iliac vessels.

Celiac: Stranding about the celiac axis. Dissection of the celiac
trunk and partial thrombosis related to the dissection of the vessel
lumen. The splenic artery beyond this level is patent.

Caliber of the celiac trunk approximately 12 mm.

There is an accessory LEFT hepatic artery arising as a dedicated
branch just above the celiac origin. Remaining hepatic vessels
arising from the dissected celiac trunk are patent despite stranding
surrounding the common hepatic artery, likely due to dissection
propagation into this vessel.

SMA: Widely patent without dilation, dissection or adjacent
stranding.

Renals: Early bifurcation of the RIGHT renal artery, main renal
artery with an accessory artery arising from the aorta to the upper
pole as well.

Early bifurcation of single LEFT renal artery. No perivascular
stranding or signs of dissection of the renal vasculature.

IMA: Patent without evidence of aneurysm, dissection, vasculitis or
significant stenosis.

Inflow: Patent without evidence of aneurysm, dissection, vasculitis
or significant stenosis.

Proximal outflow: Iliac vessels are patent. No stranding or signs of
dissection in the iliac vasculature.

Veins: Veins not well assessed on the current evaluation, normal
caliber. Smooth contour of the IVC.

Review of the MIP images confirms the above findings.

NON-VASCULAR

Hepatobiliary: Signs of hepatic steatosis. No perihepatic stranding.
Trace amount of fluid along the inferior margin of the RIGHT hemi
liver, too small to measure density values in this location. No
focal hepatic lesion no pericholecystic stranding or sign of biliary
duct distension.

Pancreas: Normal, without mass, inflammation or ductal dilatation.

Spleen: Top normal splenic size. No current signs of gross. Splenic
infarct

Adrenals/Urinary Tract: Normal adrenal glands. Cysts in the
bilateral kidneys. Intermediate density arises from the lower pole
of the LEFT kidney which is indeterminate based on density values
potentially hemorrhagic cyst measuring 16 mm (image 136/5) lateral
lower pole of the LEFT kidney. Small diverticula along the LEFT
urinary bladder. No hydronephrosis.

Stomach/Bowel: Bowel enhancement is preserved. No pneumatosis.
Normal appendix. Mild jejunal mesenteric stranding is suggested. No
focal bowel wall thickening.

Lymphatic: No adenopathy in the abdomen or in the pelvis.

Reproductive: Unremarkable on CT.

Other: Fluid in the LEFT pericolic gutter with density value of
37-47 Hounsfield units may be slightly increased compared to the
previous CT evaluation remaining very small volume. This measures
approximately 1.8 x 0.9 cm and was previously not measurable
similarly with small amount of fluid anterior to the urinary bladder
38 Hounsfield units (image 187/5).

Small to moderate bilateral fat containing inguinal hernias LEFT
greater than RIGHT. No free air.

Musculoskeletal: No acute musculoskeletal finding or destructive
bone lesion. Spinal degenerative changes.

Review of the MIP images confirms the above findings.
IMPRESSION: Dissection of the celiac trunk with partial thrombosis of the vessel
lumen. Visceral branches arising from the dissected celiac trunk
remain opacified with contrast despite extensive stranding and signs
of dissection.

Accessory LEFT hepatic artery as described.

Slight increase in fluid in the LEFT lower quadrant measures density
that could be seen in the setting of small volume pneumoperitoneum.
Perhaps this somehow relates to recent iodinated contrast
administration and delayed enhancement of ascitic fluid. Occult
bowel injury can present with isolated hemoperitoneum as well though
this is not favored as there is no sign of focal bowel thickening or
other findings to suggest trauma. No visible solid organ injury is
demonstrated. I

Mild jejunal mesenteric stranding. In the setting of visceral
arterial dissection would suggest lactate correlation. This may also
be helpful in light of above findings of potential small volume
hemoperitoneum.

Indeterminate potentially cystic lesion arising from the lower pole
the LEFT kidney may represent a hemorrhagic cyst. Consider follow-up
renal sonogram in this an initial means of further evaluating this
abnormality on a nonemergent basis to exclude a solid renal lesion.

Multiple pulmonary nodules. Most severe: 4 mm left solid pulmonary
nodule. No routine follow-up imaging is recommended, particularly in
low risk patient population per [HOSPITAL] Guidelines. while
these guidelines do not apply to immunocompromised patients and
patients with cancer. Follow up in patients with significant
comorbidities as clinically warranted. For lung cancer screening,
adhere to Lung-RADS guidelines. Reference: Radiology. 0996;

ADDENDUM:
Dissection of the celiac with partial thrombosis of the vessel lumen
as well as higher density fluid in the abdomen as discussed in the
initial report were called by telephone at the time of
interpretation on 08/31/2021 at [DATE] to provider PIKADO KLUB BUNETA ,
who verbally acknowledged these results.

*** End of Addendum ***
Multidetector CT imaging through the chest, abdomen and pelvis was
performed using the standard protocol during bolus administration of
intravenous contrast. Multiplanar reconstructed images and MIPs were
obtained and reviewed to evaluate the vascular anatomy.

CONTRAST:  100mL OMNIPAQUE IOHEXOL 350 MG/ML SOLN
FINDINGS: CTA CHEST FINDINGS

Cardiovascular: Noncontrast CT first obtained of the chest shows no
signs of intramural hematoma or pericardial effusion. Ascending
aortic caliber approximately 3.7 cm transverse dimension, when
measured in the coronal plane the ascending aorta is approximately
3.6 cm greatest dimension. No signs of aortic dissection involving
the thoracic aorta on this non gated evaluation.

No sign of stranding adjacent to the aorta. Three-vessel branching
pattern in the chest. Normal heart size without substantial
pericardial effusion. Normal caliber of central pulmonary arteries
which are not well assessed on this phase of contrast enhancement.

Mediastinum/Nodes: No thoracic inlet, axillary, mediastinal or hilar
adenopathy. Esophagus grossly normal.

Lungs/Pleura: No effusion, no consolidation or pneumothorax. Airways
are patent. Tiny LEFT lower lobe pulmonary nodule (image 114/6) 4
mm.

Tiny LEFT lower lobe pulmonary nodule (image 106/6) 4 mm.

Musculoskeletal: See below for full musculoskeletal detail. Ovoid
low-density subcutaneous lesion on the first image of the study
measures water density and 2.8 cm likely a sebaceous cyst. No chest
wall lesion. See below for full musculoskeletal details.

Review of the MIP images confirms the above findings.

CTA ABDOMEN AND PELVIS FINDINGS

VASCULAR

Aorta: Aortic atherosclerosis. No aneurysmal dilation of the
thoracic aorta. No sign of dissection or perivascular stranding
about the abdominal aorta or iliac vessels.

Celiac: Stranding about the celiac axis. Dissection of the celiac
trunk and partial thrombosis related to the dissection of the vessel
lumen. The splenic artery beyond this level is patent.

Caliber of the celiac trunk approximately 12 mm.

There is an accessory LEFT hepatic artery arising as a dedicated
branch just above the celiac origin. Remaining hepatic vessels
arising from the dissected celiac trunk are patent despite stranding
surrounding the common hepatic artery, likely due to dissection
propagation into this vessel.

SMA: Widely patent without dilation, dissection or adjacent
stranding.

Renals: Early bifurcation of the RIGHT renal artery, main renal
artery with an accessory artery arising from the aorta to the upper
pole as well.

Early bifurcation of single LEFT renal artery. No perivascular
stranding or signs of dissection of the renal vasculature.

IMA: Patent without evidence of aneurysm, dissection, vasculitis or
significant stenosis.

Inflow: Patent without evidence of aneurysm, dissection, vasculitis
or significant stenosis.

Proximal outflow: Iliac vessels are patent. No stranding or signs of
dissection in the iliac vasculature.

Veins: Veins not well assessed on the current evaluation, normal
caliber. Smooth contour of the IVC.

Review of the MIP images confirms the above findings.

NON-VASCULAR

Hepatobiliary: Signs of hepatic steatosis. No perihepatic stranding.
Trace amount of fluid along the inferior margin of the RIGHT hemi
liver, too small to measure density values in this location. No
focal hepatic lesion no pericholecystic stranding or sign of biliary
duct distension.

Pancreas: Normal, without mass, inflammation or ductal dilatation.

Spleen: Top normal splenic size. No current signs of gross. Splenic
infarct

Adrenals/Urinary Tract: Normal adrenal glands. Cysts in the
bilateral kidneys. Intermediate density arises from the lower pole
of the LEFT kidney which is indeterminate based on density values
potentially hemorrhagic cyst measuring 16 mm (image 136/5) lateral
lower pole of the LEFT kidney. Small diverticula along the LEFT
urinary bladder. No hydronephrosis.

Stomach/Bowel: Bowel enhancement is preserved. No pneumatosis.
Normal appendix. Mild jejunal mesenteric stranding is suggested. No
focal bowel wall thickening.

Lymphatic: No adenopathy in the abdomen or in the pelvis.

Reproductive: Unremarkable on CT.

Other: Fluid in the LEFT pericolic gutter with density value of
37-47 Hounsfield units may be slightly increased compared to the
previous CT evaluation remaining very small volume. This measures
approximately 1.8 x 0.9 cm and was previously not measurable
similarly with small amount of fluid anterior to the urinary bladder
38 Hounsfield units (image 187/5).

Small to moderate bilateral fat containing inguinal hernias LEFT
greater than RIGHT. No free air.

Musculoskeletal: No acute musculoskeletal finding or destructive
bone lesion. Spinal degenerative changes.

Review of the MIP images confirms the above findings.
IMPRESSION: Dissection of the celiac trunk with partial thrombosis of the vessel
lumen. Visceral branches arising from the dissected celiac trunk
remain opacified with contrast despite extensive stranding and signs
of dissection.

Accessory LEFT hepatic artery as described.

Slight increase in fluid in the LEFT lower quadrant measures density
that could be seen in the setting of small volume pneumoperitoneum.
Perhaps this somehow relates to recent iodinated contrast
administration and delayed enhancement of ascitic fluid. Occult
bowel injury can present with isolated hemoperitoneum as well though
this is not favored as there is no sign of focal bowel thickening or
other findings to suggest trauma. No visible solid organ injury is
demonstrated. I

Mild jejunal mesenteric stranding. In the setting of visceral
arterial dissection would suggest lactate correlation. This may also
be helpful in light of above findings of potential small volume
hemoperitoneum.

Indeterminate potentially cystic lesion arising from the lower pole
the LEFT kidney may represent a hemorrhagic cyst. Consider follow-up
renal sonogram in this an initial means of further evaluating this
abnormality on a nonemergent basis to exclude a solid renal lesion.

Multiple pulmonary nodules. Most severe: 4 mm left solid pulmonary
nodule. No routine follow-up imaging is recommended, particularly in
low risk patient population per [HOSPITAL] Guidelines. while
these guidelines do not apply to immunocompromised patients and
patients with cancer. Follow up in patients with significant
comorbidities as clinically warranted. For lung cancer screening,
adhere to Lung-RADS guidelines. Reference: Radiology. 0996;
